# Patient Record
Sex: Female | Born: 1980 | Race: Black or African American | Hispanic: No | Marital: Single | Smoking: Current every day smoker
Health system: Southern US, Community
[De-identification: ages and names within clinical notes are randomized; demographics above are authoritative.]

## PROBLEM LIST (undated history)

## (undated) DIAGNOSIS — M5090 Cervical disc disorder, unspecified, unspecified cervical region: Secondary | ICD-10-CM

## (undated) DIAGNOSIS — I10 Essential (primary) hypertension: Secondary | ICD-10-CM

## (undated) HISTORY — PX: TUBAL LIGATION: SHX77

---

## 2014-01-09 ENCOUNTER — Encounter (HOSPITAL_BASED_OUTPATIENT_CLINIC_OR_DEPARTMENT_OTHER): Payer: Self-pay | Admitting: Emergency Medicine

## 2014-01-09 ENCOUNTER — Emergency Department (HOSPITAL_BASED_OUTPATIENT_CLINIC_OR_DEPARTMENT_OTHER)
Admission: EM | Admit: 2014-01-09 | Discharge: 2014-01-10 | Disposition: A | Discharge status: Home / Self Care | Payer: Medicaid Other | Attending: Emergency Medicine | Admitting: Emergency Medicine

## 2014-01-09 ENCOUNTER — Emergency Department (HOSPITAL_BASED_OUTPATIENT_CLINIC_OR_DEPARTMENT_OTHER): Payer: Medicaid Other

## 2014-01-09 DIAGNOSIS — X58XXXA Exposure to other specified factors, initial encounter: Secondary | ICD-10-CM | POA: Insufficient documentation

## 2014-01-09 DIAGNOSIS — R209 Unspecified disturbances of skin sensation: Secondary | ICD-10-CM | POA: Insufficient documentation

## 2014-01-09 DIAGNOSIS — M542 Cervicalgia: Secondary | ICD-10-CM | POA: Diagnosis present

## 2014-01-09 DIAGNOSIS — M62838 Other muscle spasm: Secondary | ICD-10-CM

## 2014-01-09 DIAGNOSIS — T148XXA Other injury of unspecified body region, initial encounter: Secondary | ICD-10-CM

## 2014-01-09 DIAGNOSIS — S139XXA Sprain of joints and ligaments of unspecified parts of neck, initial encounter: Secondary | ICD-10-CM | POA: Diagnosis not present

## 2014-01-09 DIAGNOSIS — Y929 Unspecified place or not applicable: Secondary | ICD-10-CM | POA: Insufficient documentation

## 2014-01-09 DIAGNOSIS — Z8739 Personal history of other diseases of the musculoskeletal system and connective tissue: Secondary | ICD-10-CM | POA: Diagnosis not present

## 2014-01-09 DIAGNOSIS — Y939 Activity, unspecified: Secondary | ICD-10-CM | POA: Insufficient documentation

## 2014-01-09 HISTORY — DX: Cervical disc disorder, unspecified, unspecified cervical region: M50.90

## 2014-01-09 NOTE — ED Notes (Signed)
C/o neck pain and tingling down left arm x 1 week-denies injury

## 2014-01-10 ENCOUNTER — Encounter (HOSPITAL_BASED_OUTPATIENT_CLINIC_OR_DEPARTMENT_OTHER): Payer: Self-pay | Admitting: Emergency Medicine

## 2014-01-10 MED ORDER — MELOXICAM 15 MG PO TABS: 15.0000 mg | ORAL_TABLET | Freq: Every day | ORAL | Status: AC

## 2014-01-10 MED ORDER — KETOROLAC TROMETHAMINE 60 MG/2ML IM SOLN
60.0000 mg | Freq: Once | INTRAMUSCULAR | Status: AC
  Administered 2014-01-10: 60 mg via INTRAMUSCULAR
  Filled 2014-01-10: qty 2

## 2014-01-10 MED ORDER — LIDOCAINE 5 % EX PTCH: 1.0000 | MEDICATED_PATCH | CUTANEOUS | Status: AC

## 2014-01-10 MED ORDER — METHOCARBAMOL 500 MG PO TABS
1000.0000 mg | ORAL_TABLET | Freq: Once | ORAL | Status: AC
  Administered 2014-01-10: 1000 mg via ORAL
  Filled 2014-01-10: qty 2

## 2014-01-10 MED ORDER — METHOCARBAMOL 750 MG PO TABS: 750.0000 mg | ORAL_TABLET | Freq: Three times a day (TID) | ORAL | Status: DC

## 2014-01-10 NOTE — ED Provider Notes (Signed)
CSN: [id number]     Arrival date & time 01/09/14  2243 History  This chart was scribed for Shakara Tweedy Smitty Cords, MD by Charline Bills, ED Scribe. The patient was seen in room MH05/MH05. Patient's care was started at 12:10 AM.   Chief Complaint  Patient presents with  . Neck Pain   Patient is a 33 y.o. female presenting with neck pain. The history is provided by the patient. No language interpreter was used.  Neck Pain Pain radiates to:  L arm Pain severity:  Moderate Pain is:  Same all the time Onset quality:  Gradual Duration:  1 week Timing:  Constant Progression:  Unchanged Chronicity:  New Context: not fall, not jumping from heights, not MCA, not MVA and not recent injury   Relieved by:  Nothing Worsened by:  Nothing tried Ineffective treatments:  None tried Associated symptoms: no bladder incontinence, no bowel incontinence, no chest pain, no fever, no headaches, no leg pain, no numbness, no paresis, no photophobia, no visual change, no weakness and no weight loss   Risk factors: no hx of head and neck radiation    HPI Comments: Karen Rodgers is a 33 y.o. female who presents to the Emergency Department complaining of constant neck pain onset 1.5 week ago. Pt reports associated tingling sensation that radiates down L arm. Pain is exacerbated with sitting up. She denies injury. Pt has tried ibuprofen 800 mg and hydrocodone with temporary relief. She has also tried hot showers with no relief.   Past Medical History  Diagnosis Date  . Cervical disc disorder    History reviewed. No pertinent past surgical history. No family history on file. History  Substance Use Topics  . Smoking status: Never Smoker   . Smokeless tobacco: Not on file  . Alcohol Use: No   OB History   Grav Para Term Preterm Abortions TAB SAB Ect Mult Living                 Review of Systems  Constitutional: Negative for fever and weight loss.  Eyes: Negative for photophobia.  Cardiovascular:  Negative for chest pain.  Gastrointestinal: Negative for bowel incontinence.  Genitourinary: Negative for bladder incontinence.  Musculoskeletal: Positive for neck pain.  Neurological: Negative for weakness, numbness and headaches.  All other systems reviewed and are negative.  Allergies  Review of patient's allergies indicates no known allergies.  Home Medications   Prior to Admission medications   Not on File   Triage Vitals: BP 173/101  Pulse 66  Temp(Src) 98 F (36.7 C) (Oral)  Resp 18  Ht  (1.575 m)  Wt 155 lb (70.308 kg)  BMI 28.34 kg/m2  SpO2 100%  LMP 12/28/2013 Physical Exam  Nursing note and vitals reviewed. Constitutional: She is oriented to person, place, and time. She appears well-developed and well-nourished. No distress.  HENT:  Head: Normocephalic and atraumatic.  Mouth/Throat: Oropharynx is clear and moist.  Eyes: Conjunctivae and EOM are normal. Pupils are equal, round, and reactive to light.  Neck: Normal range of motion. Neck supple. No JVD present. No tracheal deviation present. No thyromegaly present.  Cardiovascular: Normal rate.   Pulses:      Radial pulses are 3+ on the left side.  Less than 2 seconds capillary refills  Pulmonary/Chest: Effort normal. No stridor. No respiratory distress.  Abdominal: Soft. Bowel sounds are normal. There is no tenderness. There is no rebound.  Musculoskeletal: Normal range of motion.  No stepoff or crepitus to c spine.  Negative Neer's test  Lymphadenopathy:    She has no cervical adenopathy.  Neurological: She is alert and oriented to person, place, and time. She has normal reflexes.  5/5 strength LUE left hand NVI, intact DTRs, negative neers test.  Sensation and motor intact  Skin: Skin is warm and dry.  Psychiatric: She has a normal mood and affect. Her behavior is normal.   ED Course  Procedures (including critical care time) DIAGNOSTIC STUDIES: Oxygen Saturation is 100% on RA, normal by my  interpretation.    COORDINATION OF CARE: 12:14 AM-Discussed treatment plan which includes XR, anti-inflammatory and muscle relaxant with pt at bedside and pt agreed to plan.   Labs Review Labs Reviewed - No data to display  Imaging Review Dg Cervical Spine Complete  01/10/2014   CLINICAL DATA:  Neck pain with tingling down left arm for 1-1/2 weeks with no injury  EXAM: CERVICAL SPINE  4+ VIEWS  COMPARISON:  None.  FINDINGS: Reversed lordosis. No prevertebral soft tissue swelling. No fracture. 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 No foraminal narrowing.  IMPRESSION: No acute osseous abnormalities. 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  Reversed lordosis, possibly due to muscle spasm.   Electronically Signed   By: Esperanza Heir M.D.   On: 01/10/2014 00:01    EKG Interpretation None      MDM   Final diagnoses:  None    Strained muscle will refer to sports medicine.  Patient does not want narcotics as these have not helped  I personally performed the services described in this documentation, which was scribed in my presence. The recorded information has been reviewed and is accurate.    Jasmine Awe, MD 01/10/14 [phone]

## 2015-07-30 ENCOUNTER — Emergency Department (HOSPITAL_BASED_OUTPATIENT_CLINIC_OR_DEPARTMENT_OTHER)
Admission: EM | Admit: 2015-07-30 | Discharge: 2015-07-30 | Disposition: A | Discharge status: Home / Self Care | Payer: Medicaid Other | Attending: Emergency Medicine | Admitting: Emergency Medicine

## 2015-07-30 ENCOUNTER — Encounter (HOSPITAL_BASED_OUTPATIENT_CLINIC_OR_DEPARTMENT_OTHER): Payer: Self-pay | Admitting: *Deleted

## 2015-07-30 DIAGNOSIS — M546 Pain in thoracic spine: Secondary | ICD-10-CM | POA: Insufficient documentation

## 2015-07-30 DIAGNOSIS — I10 Essential (primary) hypertension: Secondary | ICD-10-CM | POA: Insufficient documentation

## 2015-07-30 DIAGNOSIS — Z79899 Other long term (current) drug therapy: Secondary | ICD-10-CM | POA: Diagnosis not present

## 2015-07-30 DIAGNOSIS — Z3202 Encounter for pregnancy test, result negative: Secondary | ICD-10-CM | POA: Diagnosis not present

## 2015-07-30 DIAGNOSIS — Z791 Long term (current) use of non-steroidal anti-inflammatories (NSAID): Secondary | ICD-10-CM | POA: Insufficient documentation

## 2015-07-30 HISTORY — DX: Essential (primary) hypertension: I10

## 2015-07-30 LAB — PREGNANCY, URINE: PREG TEST UR: NEGATIVE

## 2015-07-30 MED ORDER — METHOCARBAMOL 500 MG PO TABS: 500.0000 mg | ORAL_TABLET | Freq: Two times a day (BID) | ORAL | Status: AC | PRN

## 2015-07-30 MED ORDER — HYDROCODONE-ACETAMINOPHEN 5-325 MG PO TABS
2.0000 | ORAL_TABLET | Freq: Once | ORAL | Status: AC
  Administered 2015-07-30: 2 via ORAL
  Filled 2015-07-30: qty 2

## 2015-07-30 MED ORDER — NAPROXEN 500 MG PO TABS: 500.0000 mg | ORAL_TABLET | Freq: Two times a day (BID) | ORAL | Status: AC

## 2015-07-30 NOTE — ED Notes (Addendum)
C/o L upper back pain, onset 3d ago after manual heavy lifting, "think I have strained something", worse with movement and inspiration, associated with CP. (denies: cough, congestion, sob, nvd, fever, sx other than pain), took ibuprofen at 1500 today.  Alert, NAD, calm, interactive, resps e/u, no dyspnea noted.

## 2015-07-30 NOTE — Discharge Instructions (Signed)
Back Pain, Adult °Back pain is very common in adults. The cause of back pain is rarely dangerous and the pain often gets better over time. The cause of your back pain may not be known. Some common causes of back pain include: °1. Strain of the muscles or ligaments supporting the spine. °2. Wear and tear (degeneration) of the spinal disks. °3. Arthritis. °4. Direct injury to the back. °For many people, back pain may return. Since back pain is rarely dangerous, most people can learn to manage this condition on their own. °HOME CARE INSTRUCTIONS °Watch your back pain for any changes. The following actions may help to lessen any discomfort you are feeling: °1. Remain active. It is stressful on your back to sit or stand in one place for long periods of time. Do not sit, drive, or stand in one place for more than 30 minutes at a time. Take short walks on even surfaces as soon as you are able. Try to increase the length of time you walk each day. °2. Exercise regularly as directed by your health care provider. Exercise helps your back heal faster. It also helps avoid future injury by keeping your muscles strong and flexible. °3. Do not stay in bed. Resting more than 1-2 days can delay your recovery. °4. Pay attention to your body when you bend and lift. The most comfortable positions are those that put less stress on your recovering back. Always use proper lifting techniques, including: °1. Bending your knees. °2. Keeping the load close to your body. °3. Avoiding twisting. °5. Find a comfortable position to sleep. Use a firm mattress and lie on your side with your knees slightly bent. If you lie on your back, put a pillow under your knees. °6. Avoid feeling anxious or stressed. Stress increases muscle tension and can worsen back pain. It is important to recognize when you are anxious or stressed and learn ways to manage it, such as with exercise. °7. Take medicines only as directed by your health care provider.  Over-the-counter medicines to reduce pain and inflammation are often the most helpful. Your health care provider may prescribe muscle relaxant drugs. These medicines help dull your pain so you can more quickly return to your normal activities and healthy exercise. °8. Apply ice to the injured area: °1. Put ice in a plastic bag. °2. Place a towel between your skin and the bag. °3. Leave the ice on for 20 minutes, 2-3 times a day for the first 2-3 days. After that, ice and heat may be alternated to reduce pain and spasms. °9. Maintain a healthy weight. Excess weight puts extra stress on your back and makes it difficult to maintain good posture. °SEEK MEDICAL CARE IF: °1. You have pain that is not relieved with rest or medicine. °2. You have increasing pain going down into the legs or buttocks. °3. You have pain that does not improve in one week. °4. You have night pain. °5. You lose weight. °6. You have a fever or chills. °SEEK IMMEDIATE MEDICAL CARE IF:  °1. You develop new bowel or bladder control problems. °2. You have unusual weakness or numbness in your arms or legs. °3. You develop nausea or vomiting. °4. You develop abdominal pain. °5. You feel faint. °  °This information is not intended to replace advice given to you by your health care provider. Make sure you discuss any questions you have with your health care provider. °  °Document Released: 04/07/2005 Document Revised: 04/28/2014 Document Reviewed: 08/09/2013 °Elsevier Interactive Patient Education ©2016 [employer] ° °Back Exercises °The following exercises strengthen the muscles that help to support the back. They also help to keep the lower back flexible. Doing these exercises can help to prevent back pain or lessen existing pain. °If you have back pain or discomfort, try doing these exercises 2-3 times each day or as told by your health care provider. When the pain goes away, do them once each day, but increase the number of times that you repeat the  steps for each exercise (do more repetitions). If you do not have back pain or discomfort, do these exercises once each day or as told by your health care provider. °EXERCISES °Single Knee to Chest °Repeat these steps 3-5 times for each leg: °5. Lie on your back on a firm bed or the floor with your legs extended. °6. Bring one knee to your chest. Your other leg should stay extended and in contact with the floor. °7. Hold your knee in place by grabbing your knee or thigh. °8. Pull on your knee until you feel a gentle stretch in your lower back. °9. Hold the stretch for 10-30 seconds. °10. Slowly release and straighten your leg. °Pelvic Tilt °Repeat these steps 5-10 times: °10. Lie on your back on a firm bed or the floor with your legs extended. °11. Bend your knees so they are pointing toward the ceiling and your feet are flat on the floor. °12. Tighten your lower abdominal muscles to press your lower back against the floor. This motion will tilt your pelvis so your tailbone points up toward the ceiling instead of pointing to your feet or the floor. °13. With gentle tension and even breathing, hold this position for 5-10 seconds. °Cat-Cow °Repeat these steps until your lower back becomes more flexible: °7. Get into a hands-and-knees position on a firm surface. Keep your hands under your shoulders, and keep your knees under your hips. You may place padding under your knees for comfort. °8. Let your head hang down, and point your tailbone toward the floor so your lower back becomes rounded like the back of a cat. °9. Hold this position for 5 seconds. °10. Slowly lift your head and point your tailbone up toward the ceiling so your back forms a sagging arch like the back of a cow. °11. Hold this position for 5 seconds. °Press-Ups °Repeat these steps 5-10 times: °6. Lie on your abdomen (face-down) on the floor. °7. Place your palms near your head, about shoulder-width apart. °8. While you keep your back as relaxed as  possible and keep your hips on the floor, slowly straighten your arms to raise the top half of your body and lift your shoulders. Do not use your back muscles to raise your upper torso. You may adjust the placement of your hands to make yourself more comfortable. °9. Hold this position for 5 seconds while you keep your back relaxed. °10. Slowly return to lying flat on the floor. °Bridges °Repeat these steps 10 times: °1. Lie on your back on a firm surface. °2. Bend your knees so they are pointing toward the ceiling and your feet are flat on the floor. °3. Tighten your buttocks muscles and lift your buttocks off of the floor until your waist is at almost the same height as your knees. You should feel the muscles working in your buttocks and the back of your thighs. If you do not feel these muscles, slide your feet 1-2 inches farther away from your buttocks. °4. Hold this   position for 3-5 seconds. °5. Slowly lower your hips to the starting position, and allow your buttocks muscles to relax completely. °If this exercise is too easy, try doing it with your arms crossed over your chest. °Abdominal Crunches °Repeat these steps 5-10 times: °1. Lie on your back on a firm bed or the floor with your legs extended. °2. Bend your knees so they are pointing toward the ceiling and your feet are flat on the floor. °3. Cross your arms over your chest. °4. Tip your chin slightly toward your chest without bending your neck. °5. Tighten your abdominal muscles and slowly raise your trunk (torso) high enough to lift your shoulder blades a tiny bit off of the floor. Avoid raising your torso higher than that, because it can put too much stress on your low back and it does not help to strengthen your abdominal muscles. °6. Slowly return to your starting position. °Back Lifts °Repeat these steps 5-10 times: °1. Lie on your abdomen (face-down) with your arms at your sides, and rest your forehead on the floor. °2. Tighten the muscles in your  legs and your buttocks. °3. Slowly lift your chest off of the floor while you keep your hips pressed to the floor. Keep the back of your head in line with the curve in your back. Your eyes should be looking at the floor. °4. Hold this position for 3-5 seconds. °5. Slowly return to your starting position. °SEEK MEDICAL CARE IF: °· Your back pain or discomfort gets much worse when you do an exercise. °· Your back pain or discomfort does not lessen within 2 hours after you exercise. °If you have any of these problems, stop doing these exercises right away. Do not do them again unless your health care provider says that you can. °SEEK IMMEDIATE MEDICAL CARE IF: °· You develop sudden, severe back pain. If this happens, stop doing the exercises right away. Do not do them again unless your health care provider says that you can. °  °This information is not intended to replace advice given to you by your health care provider. Make sure you discuss any questions you have with your health care provider. °  °Document Released: 05/15/2004 Document Revised: 12/27/2014 Document Reviewed: 06/01/2014 °Elsevier Interactive Patient Education ©2016 [employer] ° °

## 2015-07-30 NOTE — ED Provider Notes (Signed)
CSN: [id number]     Arrival date & time 07/30/15  0000 History   First MD Initiated Contact with Patient 07/30/15 0017     Chief Complaint  Patient presents with  . Back Pain    Karen Rodgers is a 35 y.o. female who presents to the ED complaining of left upper back pain for the past 3 days. She reports she was moving a heavy box at work 3 days ago and after began having pain to her left upper back. Her pain is worse with movement. She currently complains of 5 out of 10 pain. She reports taking ibuprofen earlier this morning with some relief. She denies falls or trauma to her back. She denies fevers, chest pain, shortness of breath, numbness, tingling, weakness, leg pain, leg swelling, hemoptysis, endogenous estrogen use, recent surgery, abdominal pain, nausea, vomiting, diarrhea, or rashes.   Patient is a 35 y.o. female presenting with back pain. The history is provided by the patient. No language interpreter was used.  Back Pain Associated symptoms: no abdominal pain, no chest pain, no dysuria, no fever, no headaches, no numbness and no weakness     Past Medical History  Diagnosis Date  . Cervical disc disorder   . Hypertension    History reviewed. No pertinent past surgical history. History reviewed. No pertinent family history. Social History  Substance Use Topics  . Smoking status: Never Smoker   . Smokeless tobacco: None  . Alcohol Use: No   OB History    No data available     Review of Systems  Constitutional: Negative for fever and chills.  HENT: Negative for congestion and sore throat.   Eyes: Negative for visual disturbance.  Respiratory: Negative for cough and shortness of breath.   Cardiovascular: Negative for chest pain and leg swelling.  Gastrointestinal: Negative for nausea, vomiting, abdominal pain and diarrhea.  Genitourinary: Negative for dysuria, urgency, frequency, hematuria, vaginal bleeding, vaginal discharge and difficulty urinating.  Musculoskeletal:  Positive for back pain. Negative for gait problem and neck pain.  Skin: Negative for rash.  Neurological: Negative for weakness, numbness and headaches.      Allergies  Review of patient's allergies indicates no known allergies.  Home Medications   Prior to Admission medications   Medication Sig Start Date End Date Taking? Authorizing Provider  lidocaine (LIDODERM) 5 % Place 1 patch onto the skin daily. Remove & Discard patch within 12 hours or as directed by MD 01/10/14   April Palumbo, MD  meloxicam (MOBIC) 15 MG tablet Take 1 tablet (15 mg total) by mouth daily. 01/10/14   April Palumbo, MD  methocarbamol (ROBAXIN) 500 MG tablet Take 1 tablet (500 mg total) by mouth 2 (two) times daily as needed for muscle spasms. 07/30/15   Everlene Farrier, PA-C  naproxen (NAPROSYN) 500 MG tablet Take 1 tablet (500 mg total) by mouth 2 (two) times daily with a meal. 07/30/15   Everlene Farrier, PA-C   BP 150/95 mmHg  Pulse 69  Temp(Src) 99.3 F (37.4 C) (Oral)  Resp 18  Ht  (1.575 m)  Wt 72.53 kg  BMI 29.24 kg/m2  SpO2 99%  LMP 07/19/2015 (Approximate) Physical Exam  Constitutional: She appears well-developed and well-nourished. No distress.  Nontoxic appearing.  HENT:  Head: Normocephalic and atraumatic.  Mouth/Throat: Oropharynx is clear and moist.  Eyes: Conjunctivae are normal. Pupils are equal, round, and reactive to light. Right eye exhibits no discharge. Left eye exhibits no discharge.  Neck: Neck supple. No JVD  present.  Cardiovascular: Normal rate, regular rhythm, normal heart sounds and intact distal pulses.  Exam reveals no gallop and no friction rub.   No murmur heard. Pulmonary/Chest: Effort normal and breath sounds normal. No respiratory distress. She has no wheezes. She has no rales. She exhibits no tenderness.  Lungs clear to auscultation bilaterally. Symmetric chest expansion bilaterally.  Abdominal: Soft. There is no tenderness. There is no guarding.  Musculoskeletal:  Normal range of motion. She exhibits tenderness. She exhibits no edema.       Arms: Patient has point tenderness to palpation to her left mid back. No midline neck or back tenderness to palpation. No crepitus.  Patient is spontaneously moving all extremities in a coordinated fashion exhibiting good strength.   Lymphadenopathy:    She has no cervical adenopathy.  Neurological: She is alert. She has normal reflexes. She displays normal reflexes. Coordination normal.  Sensation is intact to her bilateral upper and lower extremities. Bilateral patellar DTRs are intact. Normal gait.   Skin: Skin is warm and dry. No rash noted. She is not diaphoretic. No erythema. No pallor.  Psychiatric: She has a normal mood and affect. Her behavior is normal.  Nursing note and vitals reviewed.   ED Course  Procedures (including critical care time) Labs Review Labs Reviewed  PREGNANCY, URINE    Imaging Review No results found. I have personally reviewed and evaluated these lab results as part of my medical decision-making.   EKG Interpretation None      Filed Vitals:   07/30/15 0012 07/30/15 0014  BP: 150/95   Pulse: 69   Temp: 99.3 F (37.4 C)   TempSrc: Oral   Resp: 18   Height:  5\' 2"  (1.575 m)  Weight:  72.53 kg  SpO2: 99%      MDM   Meds given in ED:  Medications  HYDROcodone-acetaminophen (NORCO/VICODIN) 5-325 MG per tablet 2 tablet (2 tablets Oral Given 07/30/15 0039)    New Prescriptions   METHOCARBAMOL (ROBAXIN) 500 MG TABLET    Take 1 tablet (500 mg total) by mouth 2 (two) times daily as needed for muscle spasms.   NAPROXEN (NAPROSYN) 500 MG TABLET    Take 1 tablet (500 mg total) by mouth 2 (two) times daily with a meal.    Final diagnoses:  Left-sided thoracic back pain   This is a 35 y.o. female who presents to the ED complaining of left upper back pain for the past 3 days. She reports she was moving a heavy box at work 3 days ago and after began having pain to her left  upper back. Her pain is worse with movement. She currently complains of 5 out of 10 pain. She reports taking ibuprofen earlier this morning with some relief. She denies falls or trauma to her back. She denies fevers, chest pain, shortness of breath. No back pain red flags. PERC negative. On exam the patient is afebrile nontoxic appearing. She has point tenderness to her left mid back. No midline neck or back tenderness. No focal neurological deficits. No concern for cauda equina. Urine pregnancy is negative. Will discharge with prescriptions for naproxen and Robaxin. I advised the patient to follow-up with their primary care provider this week. I advised the patient to return to the emergency department with new or worsening symptoms or new concerns. The patient verbalized understanding and agreement with plan.      Everlene FarrierWilliam Danaysha Kirn, PA-C 07/30/15 0105  Cy BlamerApril Palumbo, MD 07/30/15 [phone]

## 2016-03-29 IMAGING — CR DG CERVICAL SPINE COMPLETE 4+V
5 series · 5 of 5 positions shown · non-contrast
Comparison: None.

CLINICAL DATA: Neck pain with tingling down left arm for 1-1/2
weeks with no injury

EXAM:
CERVICAL SPINE  4+ VIEWS

[w c-spine lat]
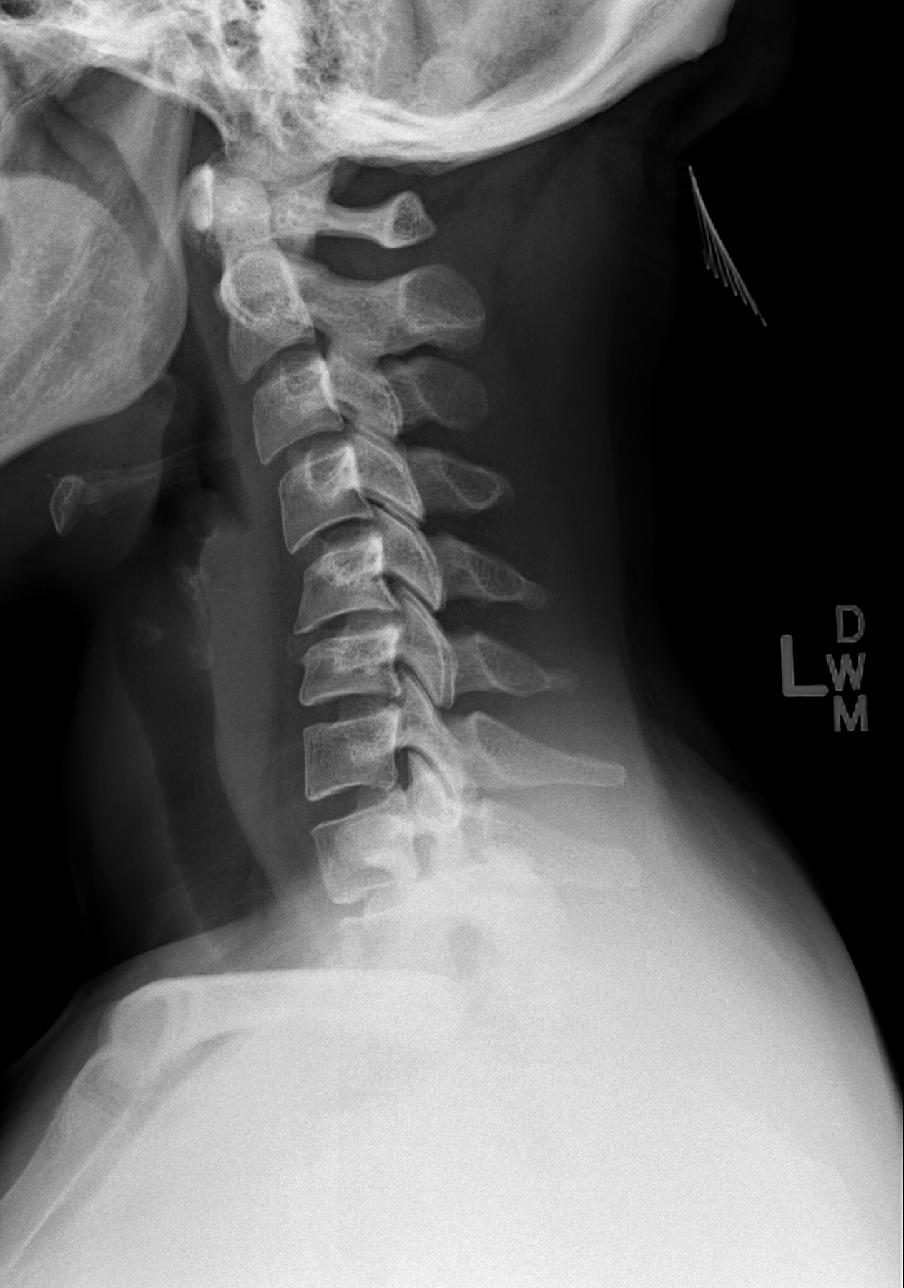

[w c-spine oblique (1 of 2)]
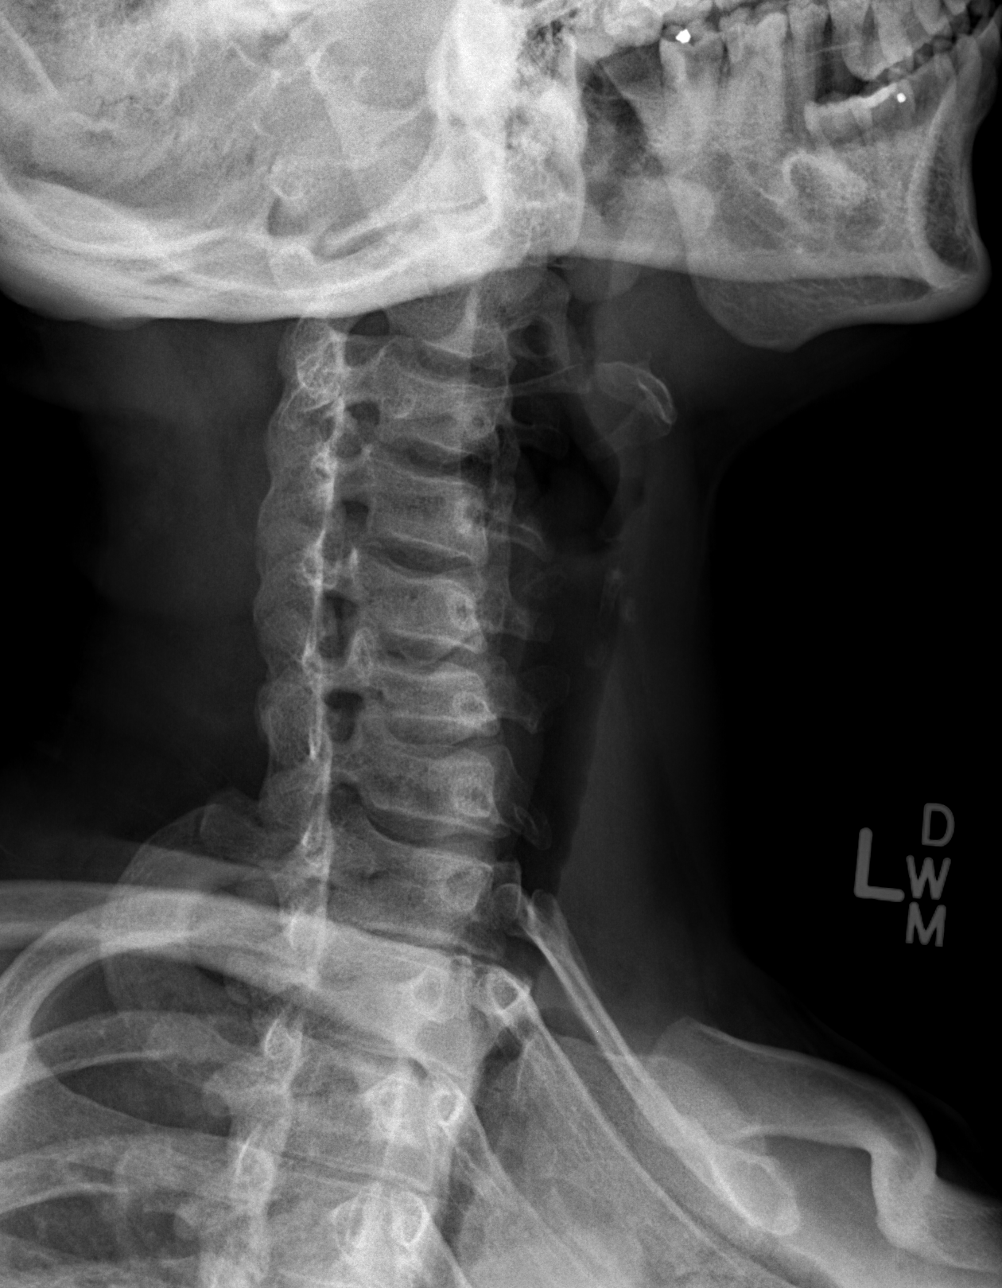

[w c-spine oblique (2 of 2)]
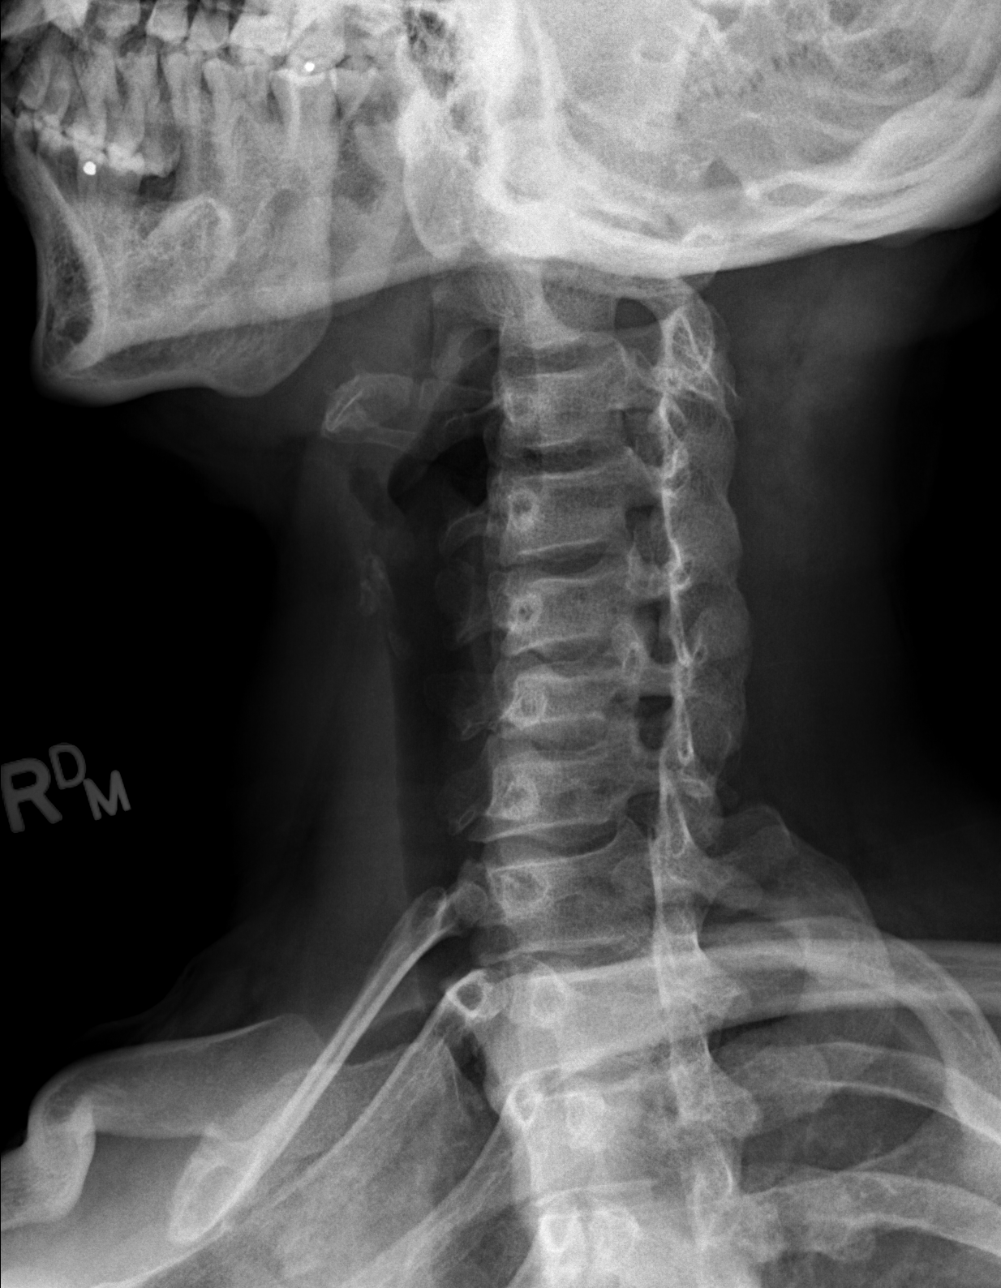

[w c-spine a.p.]
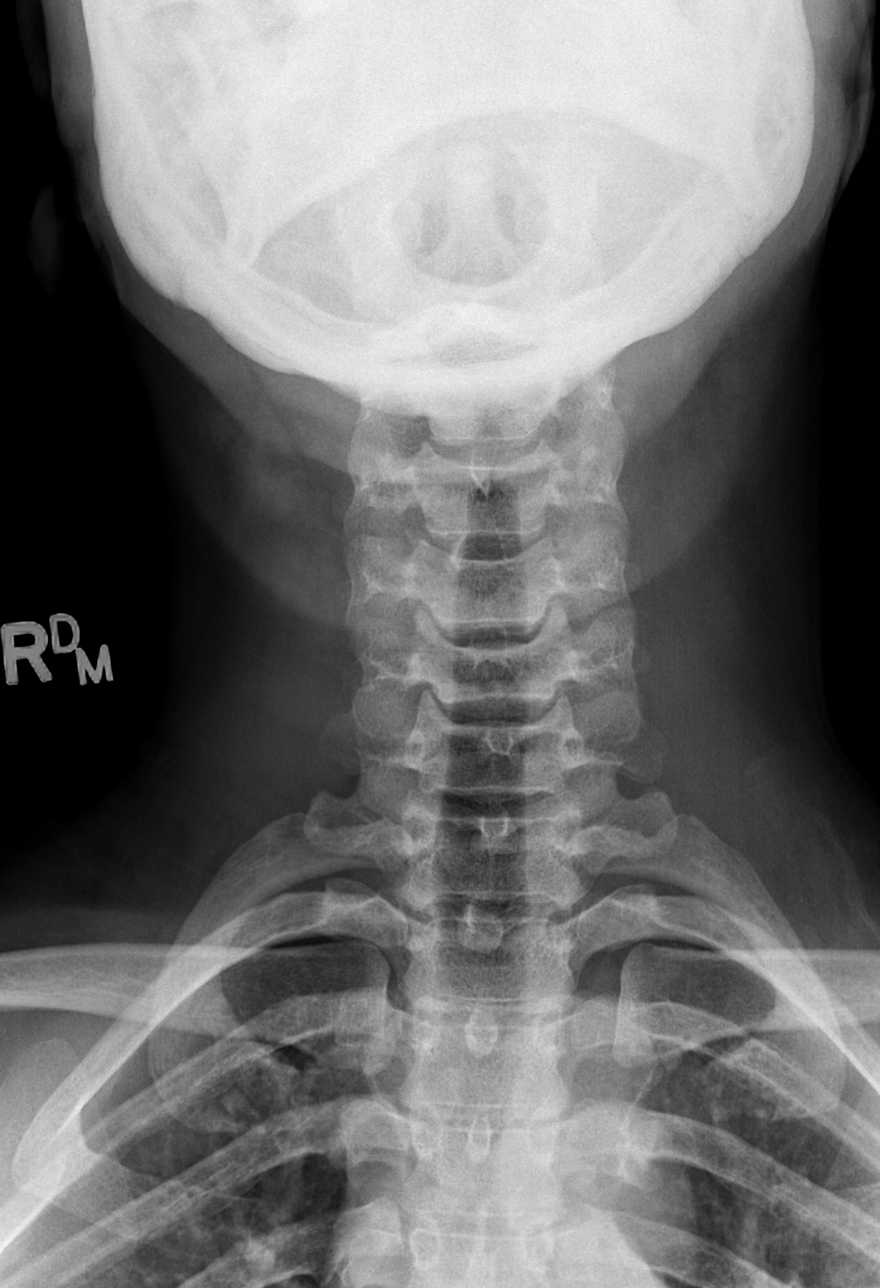

[w c-spine odontoid]
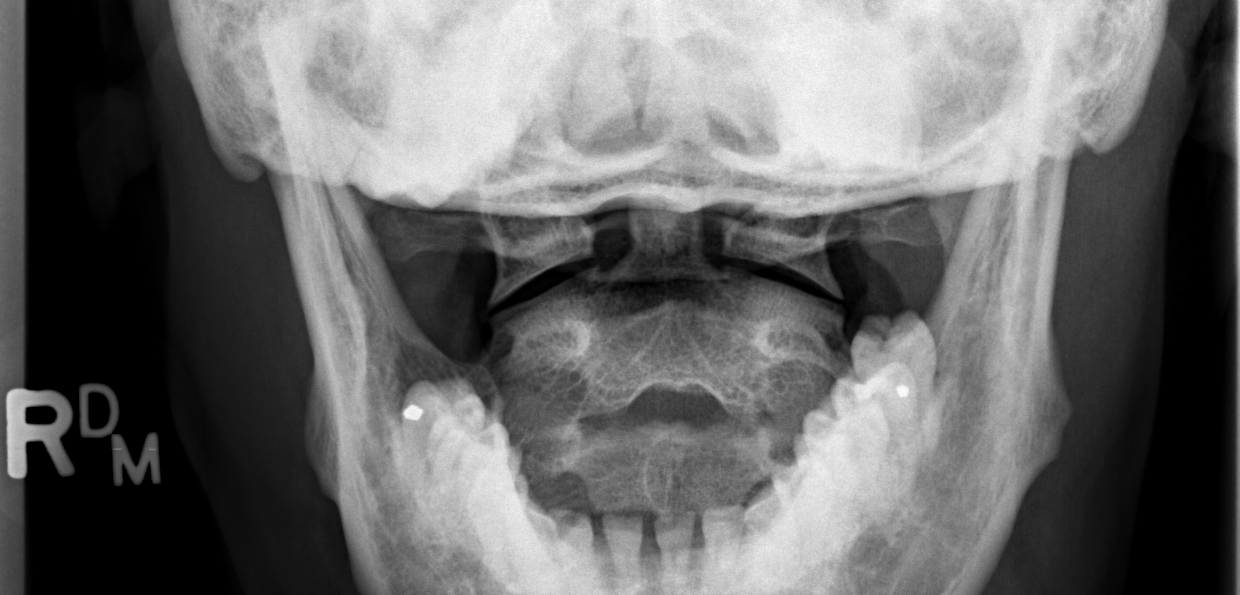

[5 of 5 positions shown; findings below may reference images not displayed]

FINDINGS: Reversed lordosis. No prevertebral soft tissue swelling. No
fracture. Mild C5-6 degenerative disc disease. No foraminal
narrowing.
IMPRESSION: No acute osseous abnormalities. Mild C5-6 degenerative disc disease.

Reversed lordosis, possibly due to muscle spasm.

## 2017-03-19 ENCOUNTER — Emergency Department (HOSPITAL_BASED_OUTPATIENT_CLINIC_OR_DEPARTMENT_OTHER)
Admission: EM | Admit: 2017-03-19 | Discharge: 2017-03-19 | Disposition: A | Discharge status: Home / Self Care | Payer: Self-pay | Attending: Emergency Medicine | Admitting: Emergency Medicine

## 2017-03-19 ENCOUNTER — Other Ambulatory Visit: Payer: Self-pay

## 2017-03-19 ENCOUNTER — Encounter (HOSPITAL_BASED_OUTPATIENT_CLINIC_OR_DEPARTMENT_OTHER): Payer: Self-pay | Admitting: *Deleted

## 2017-03-19 DIAGNOSIS — I10 Essential (primary) hypertension: Secondary | ICD-10-CM | POA: Insufficient documentation

## 2017-03-19 DIAGNOSIS — M7122 Synovial cyst of popliteal space [Baker], left knee: Secondary | ICD-10-CM | POA: Insufficient documentation

## 2017-03-19 DIAGNOSIS — M25562 Pain in left knee: Secondary | ICD-10-CM

## 2017-03-19 MED ORDER — IBUPROFEN 800 MG PO TABS
800.0000 mg | ORAL_TABLET | Freq: Once | ORAL | Status: AC
  Administered 2017-03-19: 800 mg via ORAL
  Filled 2017-03-19: qty 1

## 2017-03-19 MED ORDER — ACETAMINOPHEN 500 MG PO TABS
1000.0000 mg | ORAL_TABLET | Freq: Once | ORAL | Status: AC
  Administered 2017-03-19: 1000 mg via ORAL
  Filled 2017-03-19: qty 2

## 2017-03-19 NOTE — Discharge Instructions (Signed)
Take 4 over the counter ibuprofen tablets 3 times a day or 2 over-the-counter naproxen tablets twice a day for pain. Also take tylenol 1000mg(2 extra strength) four times a day.    

## 2017-03-19 NOTE — ED Provider Notes (Signed)
[facility] EMERGENCY DEPARTMENT Provider Note   CSN: [id number] Arrival date & time: 03/19/17  0708     History   Chief Complaint Chief Complaint  Patient presents with  . Leg Pain    HPI Karen Rodgers is a [age] y.o. female.  36 yo F with a chief complaint of left knee pain and swelling.  This been going on for the past 5 months.  Worse with movement and palpation.  Worse at the end of the day.  Describes as a dull ache.  Has taken things transiently with some improvement.  Recently tried prednisone for a couple days which mildly improved her symptoms.  She denies chest pain or shortness of breath.  Denies radiation of the pain.  Denies numbness or tingling to the leg.   The history is provided by the patient.  Leg Pain   This is a new problem. The current episode started more than 1 week ago. The problem occurs constantly. The problem has been gradually worsening. The pain is present in the left knee. The quality of the pain is described as aching. The pain is at a severity of 6/10. The pain is mild. The symptoms are aggravated by activity and contact. Treatments tried: Prednisone. The treatment provided mild relief. There has been no history of extremity trauma.    Past Medical History:  Diagnosis Date  . Cervical disc disorder   . Hypertension     There are no active problems to display for this patient.   History reviewed. No pertinent surgical history.  OB History    No data available       Home Medications    Prior to Admission medications   Medication Sig Start Date End Date Taking? Authorizing Provider  lidocaine (LIDODERM) 5 % Place 1 patch onto the skin daily. Remove & Discard patch within 12 hours or as directed by MD 01/10/14   Nicanor AlconPalumbo, April, MD  meloxicam (MOBIC) 15 MG tablet Take 1 tablet (15 mg total) by mouth daily. 01/10/14   Palumbo, April, MD  methocarbamol (ROBAXIN) 500 MG tablet Take 1 tablet (500 mg total) by mouth 2 (two) times  daily as needed for muscle spasms. 07/30/15   Everlene Farrieransie, William, PA-C  naproxen (NAPROSYN) 500 MG tablet Take 1 tablet (500 mg total) by mouth 2 (two) times daily with a meal. 07/30/15   Everlene Farrieransie, William, PA-C    Family History History reviewed. No pertinent family history.  Social History Social History   Tobacco Use  . Smoking status: Never Smoker  . Smokeless tobacco: Never Used  Substance Use Topics  . Alcohol use: No  . Drug use: No     Allergies   Patient has no known allergies.   Review of Systems Review of Systems  Constitutional: Negative for chills and fever.  HENT: Negative for congestion and rhinorrhea.   Eyes: Negative for redness and visual disturbance.  Respiratory: Negative for shortness of breath and wheezing.   Cardiovascular: Positive for leg swelling. Negative for chest pain and palpitations.  Gastrointestinal: Negative for nausea and vomiting.  Genitourinary: Negative for dysuria and urgency.  Musculoskeletal: Positive for arthralgias and myalgias.  Skin: Negative for pallor and wound.  Neurological: Negative for dizziness and headaches.     Physical Exam Updated Vital Signs BP (!) 155/82 (BP Location: Right Arm)   Pulse 63   Temp 98.4 F (36.9 C) (Oral)   Resp 18   Ht 5' 1.5" (1.562 m)   Wt 75.3 kg (  166 lb)   LMP 03/05/2017   SpO2 100%   BMI 30.86 kg/m   Physical Exam  Constitutional: She is oriented to person, place, and time. She appears well-developed and well-nourished. No distress.  HENT:  Head: Normocephalic and atraumatic.  Eyes: EOM are normal. Pupils are equal, round, and reactive to light.  Neck: Normal range of motion. Neck supple.  Cardiovascular: Normal rate and regular rhythm. Exam reveals no gallop and no friction rub.  No murmur heard. Pulmonary/Chest: Effort normal. She has no wheezes. She has no rales.  Abdominal: Soft. She exhibits no distension. There is no tenderness.  Musculoskeletal: She exhibits edema and  tenderness.  Mild fullness to the posterior aspect of the left knee.  Intact ligaments.  Full range of motion.  Mild anterior swelling.  No erythema or warmth.  Neurological: She is alert and oriented to person, place, and time.  Skin: Skin is warm and dry. She is not diaphoretic.  Psychiatric: She has a normal mood and affect. Her behavior is normal.  Nursing note and vitals reviewed.    ED Treatments / Results  Labs (all labs ordered are listed, but only abnormal results are displayed) Labs Reviewed - No data to display  EKG  EKG Interpretation None       Radiology No results found.  Procedures Procedures (including critical care time)  Medications Ordered in ED Medications  acetaminophen (TYLENOL) tablet 1,000 mg (1,000 mg Oral Given 03/19/17 0741)  ibuprofen (ADVIL,MOTRIN) tablet 800 mg (800 mg Oral Given 03/19/17 0741)     Initial Impression / Assessment and Plan / ED Course  I have reviewed the triage vital signs and the nursing notes.  Pertinent labs & imaging results that were available during my care of the patient were reviewed by me and considered in my medical decision making (see chart for details).     36 yo F with a chief complaint of left posterior leg pain and swelling.  Clinically the patient has a Baker's cyst.  She has mild fluid to the left knee.  There is no erythema no warmth.  Full range of motion.  No ligamentous disruption on exam.  We will treat the patient symptomatically.  Have her follow-up with her PCP.   7:44 AM:  I have discussed the diagnosis/risks/treatment options with the patient and believe the pt to be eligible for discharge home to follow-up with PCP. We also discussed returning to the ED immediately if new or worsening sx occur. We discussed the sx which are most concerning (e.g., sudden worsening pain, fever, inability to tolerate by mouth) that necessitate immediate return. Medications administered to the patient during their  visit and any new prescriptions provided to the patient are listed below.  Medications given during this visit Medications  acetaminophen (TYLENOL) tablet 1,000 mg (1,000 mg Oral Given 03/19/17 0741)  ibuprofen (ADVIL,MOTRIN) tablet 800 mg (800 mg Oral Given 03/19/17 0741)     The patient appears reasonably screen and/or stabilized for discharge and I doubt any other medical condition or other [facility] requiring further screening, evaluation, or treatment in the ED at this time prior to discharge.    Final Clinical Impressions(s) / ED Diagnoses   Final diagnoses:  Acute pain of left knee  Baker's cyst of knee, left    ED Discharge Orders    None       Melene PlanFloyd, Jaeli Grubb, DO 03/19/17 [phone]

## 2017-03-19 NOTE — ED Triage Notes (Signed)
Pt reports 6 months of swelling and intermittent pain to her left knee. Pt states after looking on the internet she took 2 of her daughters prednisone yesterday and they helped.denies injury or trauma

## 2019-07-02 ENCOUNTER — Encounter (HOSPITAL_BASED_OUTPATIENT_CLINIC_OR_DEPARTMENT_OTHER): Payer: Self-pay | Admitting: Emergency Medicine

## 2019-07-02 ENCOUNTER — Emergency Department (HOSPITAL_BASED_OUTPATIENT_CLINIC_OR_DEPARTMENT_OTHER)
Admission: EM | Admit: 2019-07-02 | Discharge: 2019-07-02 | Disposition: A | Discharge status: Home / Self Care | Payer: Self-pay | Attending: Emergency Medicine | Admitting: Emergency Medicine

## 2019-07-02 ENCOUNTER — Other Ambulatory Visit: Payer: Self-pay

## 2019-07-02 DIAGNOSIS — Y939 Activity, unspecified: Secondary | ICD-10-CM | POA: Insufficient documentation

## 2019-07-02 DIAGNOSIS — S70262A Insect bite (nonvenomous), left hip, initial encounter: Secondary | ICD-10-CM | POA: Insufficient documentation

## 2019-07-02 DIAGNOSIS — Z79899 Other long term (current) drug therapy: Secondary | ICD-10-CM | POA: Insufficient documentation

## 2019-07-02 DIAGNOSIS — Y9259 Other trade areas as the place of occurrence of the external cause: Secondary | ICD-10-CM | POA: Insufficient documentation

## 2019-07-02 DIAGNOSIS — I10 Essential (primary) hypertension: Secondary | ICD-10-CM | POA: Insufficient documentation

## 2019-07-02 DIAGNOSIS — Y999 Unspecified external cause status: Secondary | ICD-10-CM | POA: Insufficient documentation

## 2019-07-02 DIAGNOSIS — Z590 Homelessness: Secondary | ICD-10-CM | POA: Insufficient documentation

## 2019-07-02 DIAGNOSIS — W57XXXA Bitten or stung by nonvenomous insect and other nonvenomous arthropods, initial encounter: Secondary | ICD-10-CM | POA: Insufficient documentation

## 2019-07-02 MED ORDER — DIPHENHYDRAMINE HCL 25 MG PO TABS: 25.0000 mg | ORAL_TABLET | Freq: Four times a day (QID) | ORAL | 0 refills | Status: AC

## 2019-07-02 MED ORDER — HYDROCORTISONE 1 % EX CREA: TOPICAL_CREAM | CUTANEOUS | 0 refills | Status: AC

## 2019-07-02 NOTE — ED Provider Notes (Signed)
[facility] EMERGENCY DEPARTMENT Provider Note   CSN: [id number] Arrival date & time: 07/02/19  0932     History Chief Complaint  Patient presents with  . Insect Bite    Karen Rodgers is a 39 y.o. female with history of hypertension presents for evaluation of acute onset, persistent itching from insect bites noted today.  She states that she is homeless but staying in a hotel.  She awoke this morning and noted itching and erythema to a new lesion on her left hip, then noted another to her left upper arm.  Finally just before coming here she noted some itching to the right side of the neck and found a bedbug had just bit her.  She was able to capture the bedbug on her neck and place him in a cup.  She denies any new soaps, shampoos, detergents, or lotions.  She denies any fever, facial swelling, shortness of breath, difficulty swallowing or breathing.  No nausea or vomiting.  Has not tried anything for her symptoms.  She did bring in the insect which does appear to be a bedbug.  It does not appear to be a tick.  The history is provided by the patient.       Past Medical History:  Diagnosis Date  . Cervical disc disorder   . Hypertension     There are no problems to display for this patient.   History reviewed. No pertinent surgical history.   OB History   No obstetric history on file.     No family history on file.  Social History   Tobacco Use  . Smoking status: Never Smoker  . Smokeless tobacco: Never Used  Substance Use Topics  . Alcohol use: No  . Drug use: No    Home Medications Prior to Admission medications   Medication Sig Start Date End Date Taking? Authorizing Provider  diphenhydrAMINE (BENADRYL) 25 MG tablet Take 1 tablet (25 mg total) by mouth every 6 (six) hours. 07/02/19   Luevenia Maxin, Elpidio Thielen A, PA-C  hydrocortisone cream 1 % Apply to affected area 2 times daily 07/02/19   Luevenia Maxin, Mounir Skipper A, PA-C  lidocaine (LIDODERM) 5 % Place 1 patch onto the skin  daily. Remove & Discard patch within 12 hours or as directed by MD 01/10/14   Nicanor Alcon, April, MD  meloxicam (MOBIC) 15 MG tablet Take 1 tablet (15 mg total) by mouth daily. 01/10/14   Palumbo, April, MD  methocarbamol (ROBAXIN) 500 MG tablet Take 1 tablet (500 mg total) by mouth 2 (two) times daily as needed for muscle spasms. 07/30/15   Everlene Farrier, PA-C  naproxen (NAPROSYN) 500 MG tablet Take 1 tablet (500 mg total) by mouth 2 (two) times daily with a meal. 07/30/15   Everlene Farrier, PA-C    Allergies    Patient has no known allergies.  Review of Systems   Review of Systems  Constitutional: Negative for fever.  HENT: Negative for facial swelling.   Respiratory: Negative for shortness of breath.   Skin: Positive for rash.    Physical Exam Updated Vital Signs BP (!) 161/114 (BP Location: Left Arm)   Pulse 90   Temp 98.6 F (37 C) (Oral)   Resp 18   Ht 5\' 2"  (1.575 m)   Wt 75 kg   SpO2 100%   BMI 30.24 kg/m   Physical Exam Vitals and nursing note reviewed.  Constitutional:      General: She is not in acute distress.    Appearance:  She is well-developed.     Comments: Resting comfortably in bed.  She has a cup containing an insect in it at the bedside.  HENT:     Head: Normocephalic and atraumatic.     Mouth/Throat:     Comments: Tolerating secretions without difficulty, no upper airway stridor Eyes:     General:        Right eye: No discharge.        Left eye: No discharge.     Conjunctiva/sclera: Conjunctivae normal.  Neck:     Vascular: No JVD.     Trachea: No tracheal deviation.  Cardiovascular:     Rate and Rhythm: Normal rate.  Pulmonary:     Effort: Pulmonary effort is normal.     Comments: Speaking in full sentences without difficulty. Abdominal:     General: There is no distension.  Skin:    General: Skin is warm and dry.     Findings: Erythema present.     Comments: Erythema and mild induration noted to left upper arm.  Similar lesions noted to the  left hip and right side of the neck.  Excoriations noted.  No bulla or vesicles.  No desquamation.  Nikolsky sign absent.  Neurological:     Mental Status: She is alert.  Psychiatric:        Behavior: Behavior normal.     ED Results / Procedures / Treatments   Labs (all labs ordered are listed, but only abnormal results are displayed) Labs Reviewed - No data to display  EKG None  Radiology No results found.  Procedures Procedures (including critical care time)  Medications Ordered in ED Medications - No data to display  ED Course  I have reviewed the triage vital signs and the nursing notes.  Pertinent labs & imaging results that were available during my care of the patient were reviewed by me and considered in my medical decision making (see chart for details).    MDM Rules/Calculators/A&P                      Rash most consistent with insect bite.  Does not appear to be a tick bite.  Patient is afebrile, hypertensive but has a history of hypertension is not currently taking any medications.  I did recommend that she follow-up with a PCP for reevaluation and to closely monitor her blood pressures when ever possible.  She has no evidence of anaphylaxis or angioedema.  No signs of airway compromise.  Doubt [facility] spotted fever, SJS, TENS, syphilis.  She has a bedbug in a cup at the bedside.  Recommend hydrocortisone cream for itching, Benadryl as well.  She reports that she is not going back to the hotel.  She will follow-up with PCP for reevaluation of rash and hypertension.  Discussed strict ED return precautions. Patient verbalized understanding of and agreement with plan and is safe for discharge home at this time.  Final Clinical Impression(s) / ED Diagnoses Final diagnoses:  Insect bite, unspecified site, initial encounter  Hypertension, unspecified type    Rx / DC Orders ED Discharge Orders         Ordered    diphenhydrAMINE (BENADRYL) 25 MG tablet  Every 6  hours     07/02/19 1003    hydrocortisone cream 1 %     07/02/19 1003           Renita Papa, PA-C 07/02/19 1009    Isla Pence, MD 07/02/19  1010  

## 2019-07-02 NOTE — ED Triage Notes (Signed)
C/o several insect bites. States possible bed bugs and has a bug in a cup with her.

## 2019-07-02 NOTE — Discharge Instructions (Signed)
Apply hydrocortisone cream to areas of itching.  You can take Benadryl every 6 hours as needed for itching.  Be aware this can cause drowsiness.  Avoid itching the areas.  You can use oatmeal baths or calamine lotion to soothe the bites as well.  If your blood pressure (BP) was elevated on multiple readings during this visit above 130 for the top number or above 80 for the bottom number, please have this repeated by your primary care provider within one month. You can also check your blood pressure when you are out at a pharmacy or grocery store. Many have machines that will check your blood pressure.  If your blood pressure remains elevated, please follow-up with your PCP.  Return to the emergency department if any concerning signs or symptoms

## 2019-10-09 ENCOUNTER — Emergency Department (HOSPITAL_BASED_OUTPATIENT_CLINIC_OR_DEPARTMENT_OTHER): Payer: Medicare Other

## 2019-10-09 ENCOUNTER — Other Ambulatory Visit: Payer: Self-pay

## 2019-10-09 ENCOUNTER — Encounter (HOSPITAL_BASED_OUTPATIENT_CLINIC_OR_DEPARTMENT_OTHER): Payer: Self-pay | Admitting: *Deleted

## 2019-10-09 ENCOUNTER — Emergency Department (HOSPITAL_BASED_OUTPATIENT_CLINIC_OR_DEPARTMENT_OTHER)
Admission: EM | Admit: 2019-10-09 | Discharge: 2019-10-09 | Disposition: A | Discharge status: Home / Self Care | Payer: Medicare Other | Attending: Emergency Medicine | Admitting: Emergency Medicine

## 2019-10-09 DIAGNOSIS — R079 Chest pain, unspecified: Secondary | ICD-10-CM | POA: Diagnosis present

## 2019-10-09 DIAGNOSIS — F17210 Nicotine dependence, cigarettes, uncomplicated: Secondary | ICD-10-CM | POA: Diagnosis not present

## 2019-10-09 DIAGNOSIS — I10 Essential (primary) hypertension: Secondary | ICD-10-CM | POA: Diagnosis not present

## 2019-10-09 DIAGNOSIS — R0789 Other chest pain: Secondary | ICD-10-CM | POA: Diagnosis not present

## 2019-10-09 LAB — TROPONIN I (HIGH SENSITIVITY): Troponin I (High Sensitivity): 6 ng/L (ref <18)

## 2019-10-09 MED ORDER — IBUPROFEN 800 MG PO TABS
800.0000 mg | ORAL_TABLET | Freq: Once | ORAL | Status: AC
  Administered 2019-10-09: 800 mg via ORAL
  Filled 2019-10-09: qty 1

## 2019-10-09 MED ORDER — IBUPROFEN 800 MG PO TABS: 800.0000 mg | ORAL_TABLET | Freq: Three times a day (TID) | ORAL | 0 refills | Status: AC

## 2019-10-09 MED ORDER — ACETAMINOPHEN 500 MG PO TABS
1000.0000 mg | ORAL_TABLET | Freq: Once | ORAL | Status: AC
  Administered 2019-10-09: 1000 mg via ORAL
  Filled 2019-10-09: qty 2

## 2019-10-09 MED ORDER — LIDOCAINE 5 % EX PTCH: 2.0000 | MEDICATED_PATCH | CUTANEOUS | Status: DC

## 2019-10-09 NOTE — ED Notes (Signed)
RN to room to get blood and urine sample. Pt states she does not want urine testing or blood testing as she feels she has pulled a muscle and testing is not necessary. EDP notified.

## 2019-10-09 NOTE — ED Triage Notes (Addendum)
Pt states that she started having chest pain with movement that started on Friday. States pain with inspiration caused by pain. Denies any other symptoms. Has not taken anything for pain. Denies any recent travel or long car ride

## 2019-10-09 NOTE — ED Provider Notes (Signed)
[facility] EMERGENCY DEPARTMENT Provider Note   CSN: [id number] Arrival date & time: 10/09/19  0203     History Chief Complaint  Patient presents with  . Chest Pain    Karen Rodgers is a 39 y.o. female.  The history is provided by the patient.  Chest Pain Pain location:  R lateral chest Pain quality: aching   Pain radiates to:  Does not radiate Pain severity:  Moderate Onset quality:  Gradual Duration:  2 days Timing:  Constant Progression:  Unchanged Chronicity:  New Context: lifting and movement   Relieved by:  Nothing Worsened by:  Nothing Ineffective treatments:  None tried Associated symptoms: no abdominal pain, no altered mental status, no anorexia, no anxiety, no back pain, no claudication, no cough, no diaphoresis, no dizziness, no dysphagia, no fatigue, no fever, no headache, no heartburn, no lower extremity edema, no nausea, no near-syncope, no numbness, no orthopnea, no palpitations, no PND, no shortness of breath, no syncope, no vomiting and no weakness   Risk factors: no coronary artery disease, no diabetes mellitus, no prior DVT/PE and no surgery   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  No DOE, no SOB. No n/v/d.  No leg pain or swelling.  No travel.  No pleuritic pain.       Past Medical History:  Diagnosis Date  . Cervical disc disorder   . Hypertension     There are no problems to display for this patient.   Past Surgical History:  Procedure Laterality Date  . TUBAL LIGATION       OB History   No obstetric history on file.     History reviewed. No pertinent family history.  Social History   Tobacco Use  . Smoking status: Current Every Day Smoker  . Smokeless tobacco: Never Used  Substance Use Topics  . Alcohol use: No  . Drug use: No    Home Medications Prior to Admission medications   Medication Sig Start Date End Date Taking? Authorizing  Provider  diphenhydrAMINE (BENADRYL) 25 MG tablet Take 1 tablet (25 mg total) by mouth every 6 (six) hours. 07/02/19   Nils Flack, Mina A, PA-C  hydrocortisone cream 1 % Apply to affected area 2 times daily 07/02/19   Nils Flack, Mina A, PA-C  lidocaine (LIDODERM) 5 % Place 1 patch onto the skin daily. Remove & Discard patch within 12 hours or as directed by MD 01/10/14   Randal Buba, Temprance Wyre, MD  meloxicam (MOBIC) 15 MG tablet Take 1 tablet (15 mg total) by mouth daily. 01/10/14   Myliah Medel, MD  methocarbamol (ROBAXIN) 500 MG tablet Take 1 tablet (500 mg total) by mouth 2 (two) times daily as needed for muscle spasms. 07/30/15   Waynetta Pean, PA-C  naproxen (NAPROSYN) 500 MG tablet Take 1 tablet (500 mg total) by mouth 2 (two) times daily with a meal. 07/30/15   Waynetta Pean, PA-C    Allergies    Patient has no known allergies.  Review of Systems   Review of Systems  Constitutional: Negative for diaphoresis, fatigue and fever.  HENT: Negative for trouble swallowing.   Eyes: Negative for visual disturbance.  Respiratory: Negative for cough and shortness of breath.   Cardiovascular: Positive for chest pain. Negative for palpitations, orthopnea, claudication, syncope, PND and near-syncope.  Gastrointestinal: Negative for abdominal pain, anorexia, heartburn, nausea and vomiting.  Genitourinary: Negative for difficulty urinating.  Musculoskeletal: Negative for  back pain.  Neurological: Negative for dizziness, weakness, numbness and headaches.  Psychiatric/Behavioral: Negative for agitation.  All other systems reviewed and are negative.   Physical Exam Updated Vital Signs BP (!) 152/96 (BP Location: Left Arm)   Pulse 66   Temp 98.1 F (36.7 C) (Oral)   Resp 20   Ht 5\' 1"  (1.549 m)   Wt 61.2 kg   LMP 09/25/2019 (Approximate)   SpO2 99%   BMI 25.51 kg/m   Physical Exam Vitals and nursing note reviewed.  Constitutional:      General: She is not in acute distress.    Appearance: Normal  appearance.  HENT:     Head: Normocephalic and atraumatic.     Nose: Nose normal.  Eyes:     Conjunctiva/sclera: Conjunctivae normal.     Pupils: Pupils are equal, round, and reactive to light.  Cardiovascular:     Rate and Rhythm: Normal rate and regular rhythm.     Pulses: Normal pulses.     Heart sounds: Normal heart sounds.  Pulmonary:     Effort: Pulmonary effort is normal.     Breath sounds: Normal breath sounds.    Chest:     Chest wall: Tenderness present.  Abdominal:     General: Abdomen is flat. Bowel sounds are normal.     Palpations: Abdomen is soft.     Tenderness: There is no abdominal tenderness. There is no guarding or rebound.  Musculoskeletal:        General: Normal range of motion.     Cervical back: Normal range of motion and neck supple.  Skin:    General: Skin is warm and dry.     Capillary Refill: Capillary refill takes less than 2 seconds.  Neurological:     General: No focal deficit present.     Mental Status: She is alert and oriented to person, place, and time.  Psychiatric:        Mood and Affect: Mood normal.        Behavior: Behavior normal.     ED Results / Procedures / Treatments   Labs (all labs ordered are listed, but only abnormal results are displayed) Labs Reviewed  CBC WITH DIFFERENTIAL/PLATELET  BASIC METABOLIC PANEL  PREGNANCY, URINE  TROPONIN I (HIGH SENSITIVITY)    EKG None  Radiology DG Chest Portable 1 View  Result Date: 10/09/2019 CLINICAL DATA:  Chest pain with movement for 2 days, acute progressive worsening right-sided chest pain for 3 hours. EXAM: PORTABLE CHEST 1 VIEW COMPARISON:  None. FINDINGS: The cardiomediastinal contours are normal. The lungs are clear. Pulmonary vasculature is normal. No consolidation, pleural effusion, or pneumothorax. No acute osseous abnormalities are seen. IMPRESSION: No acute chest findings. Electronically Signed   By: 10/11/2019 M.D.   On: 10/09/2019 02:45     Procedures Procedures (including critical care time)  Medications Ordered in ED Medications  acetaminophen (TYLENOL) tablet 1,000 mg (has no administration in time range)  lidocaine (LIDODERM) 5 % 2 patch (has no administration in time range)  ibuprofen (ADVIL) tablet 800 mg (has no administration in time range)    ED Course  I have reviewed the triage vital signs and the nursing notes.  Pertinent labs & imaging results that were available during my care of the patient were reviewed by me and considered in my medical decision making (see chart for details).    PERC negative wells 0, highly doubt PE in this low risk patient.  Gien time course  one troponin was sufficient to exclude ACS.  Heart score is 1 low risk for MACE.  Symptoms are highly atypical for cardiac chest pain. Will treat for MSK pain.    Karen Rodgers was evaluated in Emergency Department on 10/09/2019 for the symptoms described in the history of present illness. She was evaluated in the context of the global COVID-19 pandemic, which necessitated consideration that the patient might be at risk for infection with the SARS-CoV-2 virus that causes COVID-19. Institutional protocols and algorithms that pertain to the evaluation of patients at risk for COVID-19 are in a state of rapid change based on information released by regulatory bodies including the CDC and federal and state organizations. These policies and algorithms were followed during the patient's care in the ED.   Final Clinical Impression(s) / ED Diagnoses Return for intractable cough, coughing up blood,fevers >100.4 unrelieved by medication, shortness of breath, intractable vomiting, chest pain, shortness of breath, weakness,numbness, changes in speech, facial asymmetry,abdominal pain, passing out,Inability to tolerate liquids or food, cough, altered mental status or any concerns. No signs of systemic illness or infection. The patient is nontoxic-appearing  on exam and vital signs are within normal limits.   I have reviewed the triage vital signs and the nursing notes. Pertinent labs &imaging results that were available during my care of the patient were reviewed by me and considered in my medical decision making (see chart for details).After history, exam, and medical workup I feel the patient has beenappropriately medically screened and is safe for discharge home. Pertinent diagnoses were discussed with the patient. Patient was given return precautions.    Karen Suto, MD 10/09/19 [id number]

## 2020-02-12 ENCOUNTER — Encounter (HOSPITAL_BASED_OUTPATIENT_CLINIC_OR_DEPARTMENT_OTHER): Payer: Self-pay | Admitting: Emergency Medicine

## 2020-02-12 ENCOUNTER — Emergency Department (HOSPITAL_BASED_OUTPATIENT_CLINIC_OR_DEPARTMENT_OTHER)
Admission: EM | Admit: 2020-02-12 | Discharge: 2020-02-12 | Disposition: A | Discharge status: Home / Self Care | Payer: Medicare Other | Attending: Emergency Medicine | Admitting: Emergency Medicine

## 2020-02-12 DIAGNOSIS — R197 Diarrhea, unspecified: Secondary | ICD-10-CM | POA: Insufficient documentation

## 2020-02-12 DIAGNOSIS — R519 Headache, unspecified: Secondary | ICD-10-CM | POA: Insufficient documentation

## 2020-02-12 DIAGNOSIS — R1110 Vomiting, unspecified: Secondary | ICD-10-CM | POA: Insufficient documentation

## 2020-02-12 DIAGNOSIS — Z5321 Procedure and treatment not carried out due to patient leaving prior to being seen by health care provider: Secondary | ICD-10-CM | POA: Diagnosis not present

## 2020-02-12 LAB — COMPREHENSIVE METABOLIC PANEL
ALT: 20 U/L (ref 0–44)
AST: 22 U/L (ref 15–41)
Albumin: 4.4 g/dL (ref 3.5–5.0)
Alkaline Phosphatase: 51 U/L (ref 38–126)
Anion gap: 10 (ref 5–15)
BUN: 9 mg/dL (ref 6–20)
CO2: 28 mmol/L (ref 22–32)
Calcium: 9.1 mg/dL (ref 8.9–10.3)
Chloride: 101 mmol/L (ref 98–111)
Creatinine, Ser: 0.7 mg/dL (ref 0.44–1.00)
GFR, Estimated: >60 mL/min (ref >60)
Glucose, Bld: 101 mg/dL — ABNORMAL HIGH (ref 70–99)
Potassium: 3.5 mmol/L (ref 3.5–5.1)
Sodium: 139 mmol/L (ref 135–145)
Total Bilirubin: 0.7 mg/dL (ref 0.3–1.2)
Total Protein: 7.8 g/dL (ref 6.5–8.1)

## 2020-02-12 LAB — CBC
HCT: 40.2 % (ref 36.0–46.0)
Hemoglobin: 12.4 g/dL (ref 12.0–15.0)
MCH: 25.4 pg — ABNORMAL LOW (ref 26.0–34.0)
MCHC: 30.8 g/dL (ref 30.0–36.0)
MCV: 82.2 fL (ref 80.0–100.0)
Platelets: 326 10*3/uL (ref 150–400)
RBC: 4.89 MIL/uL (ref 3.87–5.11)
RDW: 14.5 % (ref 11.5–15.5)
WBC: 4.5 10*3/uL (ref 4.0–10.5)
nRBC: 0 % (ref 0.0–0.2)

## 2020-02-12 LAB — URINALYSIS, ROUTINE W REFLEX MICROSCOPIC
Bilirubin Urine: NEGATIVE
Glucose, UA: NEGATIVE mg/dL
Hgb urine dipstick: NEGATIVE
Ketones, ur: NEGATIVE mg/dL
Leukocytes,Ua: NEGATIVE
Nitrite: NEGATIVE
Protein, ur: NEGATIVE mg/dL
Specific Gravity, Urine: 1.02 (ref 1.005–1.030)
pH: 7.5 (ref 5.0–8.0)

## 2020-02-12 LAB — PREGNANCY, URINE: Preg Test, Ur: NEGATIVE

## 2020-02-12 MED ORDER — ONDANSETRON 4 MG PO TBDP
4.0000 mg | ORAL_TABLET | Freq: Once | ORAL | Status: AC | PRN
  Administered 2020-02-12: 4 mg via ORAL
  Filled 2020-02-12: qty 1

## 2020-02-12 MED ORDER — IBUPROFEN 400 MG PO TABS
400.0000 mg | ORAL_TABLET | Freq: Once | ORAL | Status: AC | PRN
  Administered 2020-02-12: 400 mg via ORAL
  Filled 2020-02-12: qty 1

## 2020-02-12 NOTE — ED Triage Notes (Signed)
Headache since yesterday. Emesis and diarrhea today.

## 2020-05-08 ENCOUNTER — Encounter (HOSPITAL_BASED_OUTPATIENT_CLINIC_OR_DEPARTMENT_OTHER): Payer: Self-pay | Admitting: *Deleted

## 2020-05-08 ENCOUNTER — Emergency Department (HOSPITAL_BASED_OUTPATIENT_CLINIC_OR_DEPARTMENT_OTHER)
Admission: EM | Admit: 2020-05-08 | Discharge: 2020-05-09 | Disposition: A | Discharge status: Home / Self Care | Payer: Medicare Other | Attending: Emergency Medicine | Admitting: Emergency Medicine

## 2020-05-08 ENCOUNTER — Other Ambulatory Visit: Payer: Self-pay

## 2020-05-08 DIAGNOSIS — I10 Essential (primary) hypertension: Secondary | ICD-10-CM | POA: Insufficient documentation

## 2020-05-08 DIAGNOSIS — U071 COVID-19: Secondary | ICD-10-CM | POA: Insufficient documentation

## 2020-05-08 DIAGNOSIS — F17200 Nicotine dependence, unspecified, uncomplicated: Secondary | ICD-10-CM | POA: Diagnosis not present

## 2020-05-08 DIAGNOSIS — Z20822 Contact with and (suspected) exposure to covid-19: Secondary | ICD-10-CM

## 2020-05-08 DIAGNOSIS — R509 Fever, unspecified: Secondary | ICD-10-CM | POA: Diagnosis present

## 2020-05-08 MED ORDER — IBUPROFEN 800 MG PO TABS: 800.0000 mg | ORAL_TABLET | Freq: Once | ORAL | Status: AC

## 2020-05-08 MED ORDER — IBUPROFEN 800 MG PO TABS
ORAL_TABLET | ORAL | Status: AC
  Administered 2020-05-08: 800 mg via ORAL
  Filled 2020-05-08: qty 1

## 2020-05-08 NOTE — ED Triage Notes (Signed)
C/o Covid sx with covid exposure , fever and body aches x 1 day

## 2020-05-09 LAB — SARS CORONAVIRUS 2 (TAT 6-24 HRS): SARS Coronavirus 2: POSITIVE — AB

## 2020-05-09 NOTE — ED Provider Notes (Signed)
[facility] EMERGENCY DEPARTMENT Provider Note   CSN: [id number] Arrival date & time: 05/08/20  2032     History Chief Complaint  Patient presents with  . covid sx  . Covid Exposure    Karen Rodgers is a 40 y.o. female.  The history is provided by the patient.  URI Presenting symptoms: no congestion, no cough and no fever   Severity:  Mild Onset quality:  Gradual Duration:  1 day Timing:  Constant Progression:  Unchanged Chronicity:  New Relieved by:  Nothing Worsened by:  Nothing Ineffective treatments:  None tried Associated symptoms: myalgias   Risk factors: not elderly   Exposed to covid at work.Subjective fevers and body aches x 1 day.  Patient is unvaccinated.       Past Medical History:  Diagnosis Date  . Cervical disc disorder   . Hypertension     There are no problems to display for this patient.   Past Surgical History:  Procedure Laterality Date  . TUBAL LIGATION       OB History   No obstetric history on file.     No family history on file.  Social History   Tobacco Use  . Smoking status: Current Every Day Smoker  . Smokeless tobacco: Never Used  Substance Use Topics  . Alcohol use: No  . Drug use: Yes    Types: Marijuana    Home Medications Prior to Admission medications   Medication Sig Start Date End Date Taking? Authorizing Provider  diphenhydrAMINE (BENADRYL) 25 MG tablet Take 1 tablet (25 mg total) by mouth every 6 (six) hours. 07/02/19   Luevenia Maxin, Mina A, PA-C  hydrocortisone cream 1 % Apply to affected area 2 times daily 07/02/19   Luevenia Maxin, Mina A, PA-C  ibuprofen (ADVIL) 800 MG tablet Take 1 tablet (800 mg total) by mouth 3 (three) times daily. 10/09/19   Shawniece Oyola, MD  lidocaine (LIDODERM) 5 % Place 1 patch onto the skin daily. Remove & Discard patch within 12 hours or as directed by MD 01/10/14   Nicanor Alcon, Kairav Russomanno, MD  meloxicam (MOBIC) 15 MG tablet Take 1 tablet (15 mg total) by mouth daily. 01/10/14   Valynn Schamberger,  Bethzaida Boord, MD  methocarbamol (ROBAXIN) 500 MG tablet Take 1 tablet (500 mg total) by mouth 2 (two) times daily as needed for muscle spasms. 07/30/15   Everlene Farrier, PA-C  naproxen (NAPROSYN) 500 MG tablet Take 1 tablet (500 mg total) by mouth 2 (two) times daily with a meal. 07/30/15   Everlene Farrier, PA-C    Allergies    Patient has no known allergies.  Review of Systems   Review of Systems  Constitutional: Negative for fever.  HENT: Negative for congestion.   Eyes: Negative for visual disturbance.  Respiratory: Negative for cough and shortness of breath.   Cardiovascular: Negative for chest pain.  Gastrointestinal: Negative for abdominal pain.  Genitourinary: Negative for difficulty urinating.  Musculoskeletal: Positive for myalgias.  Neurological: Negative for dizziness.  Psychiatric/Behavioral: Negative for agitation.  All other systems reviewed and are negative.   Physical Exam Updated Vital Signs BP 129/88 (BP Location: Left Arm)   Pulse 63   Temp 98.3 F (36.8 C) (Oral)   Resp 16   Ht 5\' 1"  (1.549 m)   Wt 63.5 kg   LMP 04/26/2020   SpO2 100%   BMI 26.45 kg/m   Physical Exam Vitals and nursing note reviewed.  Constitutional:      General: She is not in acute  distress.    Appearance: Normal appearance.  HENT:     Head: Normocephalic and atraumatic.     Nose: Nose normal.  Eyes:     Conjunctiva/sclera: Conjunctivae normal.     Pupils: Pupils are equal, round, and reactive to light.  Cardiovascular:     Rate and Rhythm: Normal rate and regular rhythm.     Pulses: Normal pulses.     Heart sounds: Normal heart sounds.  Pulmonary:     Effort: Pulmonary effort is normal.     Breath sounds: Normal breath sounds.  Abdominal:     General: Abdomen is flat. Bowel sounds are normal.     Palpations: Abdomen is soft.     Tenderness: There is no abdominal tenderness. There is no guarding.  Musculoskeletal:        General: Normal range of motion.     Cervical back:  Normal range of motion and neck supple. No rigidity.  Skin:    General: Skin is warm and dry.     Capillary Refill: Capillary refill takes less than 2 seconds.  Neurological:     General: No focal deficit present.     Mental Status: She is alert and oriented to person, place, and time.     Deep Tendon Reflexes: Reflexes normal.  Psychiatric:        Mood and Affect: Mood normal.        Behavior: Behavior normal.     ED Results / Procedures / Treatments   Labs (all labs ordered are listed, but only abnormal results are displayed) Labs Reviewed  SARS CORONAVIRUS 2 (TAT 6-24 HRS)    EKG None  Radiology No results found.  Procedures Procedures (including critical care time)  Medications Ordered in ED Medications  ibuprofen (ADVIL) tablet 800 mg (800 mg Oral Given 05/08/20 2049)    ED Course  I have reviewed the triage vital signs and the nursing notes.  Pertinent labs & imaging results that were available during my care of the patient were reviewed by me and considered in my medical decision making (see chart for details).   Patient is angry that she did not get the rapid test for all viral etiologies.  EDP apologized profusely for this.  We will provide a work note.  Patient is well appearing and no additional work up is needed at this time.     Karen Rodgers was evaluated in Emergency Department on 05/09/2020 for the symptoms described in the history of present illness. She was evaluated in the context of the global COVID-19 pandemic, which necessitated consideration that the patient might be at risk for infection with the SARS-CoV-2 virus that causes COVID-19. Institutional protocols and algorithms that pertain to the evaluation of patients at risk for COVID-19 are in a state of rapid change based on information released by regulatory bodies including the CDC and federal and state organizations. These policies and algorithms were followed during the patient's care in the  ED.  Final Clinical Impression(s) / ED Diagnoses  Return for intractable cough, coughing up blood,fevers >100.4 unrelieved by medication, shortness of breath, intractable vomiting, chest pain, shortness of breath, weakness,numbness, changes in speech, facial asymmetry,abdominal pain, passing out,Inability to tolerate liquids or food, cough, altered mental status or any concerns. No signs of systemic illness or infection. The patient is nontoxic-appearing on exam and vital signs are within normal limits.   I have reviewed the triage vital signs and the nursing notes. Pertinent labs &imaging results that were available  during my care of the patient were reviewed by me and considered in my medical decision making (see chart for details).After history, exam, and medical workup I feel the patient has beenappropriately medically screened and is safe for discharge home. Pertinent diagnoses were discussed with the patient. Patient was given return precautions.   Farron Watrous, MD 05/09/20 [phone]

## 2020-05-09 NOTE — ED Notes (Signed)
AVS provided to client, discussed staying hydrated, discussed use of Tylenol and Ibuprofen. Informed pt that if Covid results are Positive she will rec a phone call of those results. Opportunity for questions provided

## 2020-05-09 NOTE — Discharge Instructions (Signed)
Person Under Monitoring Name: Karen Rodgers  Location: [address] [place] [address]   Infection Prevention Recommendations for Individuals Confirmed to have, or Being Evaluated for, 2019 Novel Coronavirus (COVID-19) Infection Who Receive Care at Home  Individuals who are confirmed to have, or are being evaluated for, COVID-19 should follow the prevention steps below until a healthcare provider or local or state health department says they can return to normal activities.  Stay home except to get medical care You should restrict activities outside your home, except for getting medical care. Do not go to work, school, or public areas, and do not use public transportation or taxis.  Call ahead before visiting your doctor Before your medical appointment, call the healthcare provider and tell them that you have, or are being evaluated for, COVID-19 infection. This will help the healthcare providers office take steps to keep other people from getting infected. Ask your healthcare provider to call the local or state health department.  Monitor your symptoms Seek prompt medical attention if your illness is worsening (e.g., difficulty breathing). Before going to your medical appointment, call the healthcare provider and tell them that you have, or are being evaluated for, COVID-19 infection. Ask your healthcare provider to call the local or state health department.  Wear a facemask You should wear a facemask that covers your nose and mouth when you are in the same room with other people and when you visit a healthcare provider. People who live with or visit you should also wear a facemask while they are in the same room with you.  Separate yourself from other people in your home As much as possible, you should stay in a different room from other people in your home. Also, you should use a separate bathroom, if available.  Avoid sharing household items You should  not share dishes, drinking glasses, cups, eating utensils, towels, bedding, or other items with other people in your home. After using these items, you should wash them thoroughly with soap and water.  Cover your coughs and sneezes Cover your mouth and nose with a tissue when you cough or sneeze, or you can cough or sneeze into your sleeve. Throw used tissues in a lined trash can, and immediately wash your hands with soap and water for at least 20 seconds or use an alcohol-based hand rub.  Wash your [employer] your hands often and thoroughly with soap and water for at least 20 seconds. You can use an alcohol-based hand sanitizer if soap and water are not available and if your hands are not visibly dirty. Avoid touching your eyes, nose, and mouth with unwashed hands.   Prevention Steps for Caregivers and Household Members of Individuals Confirmed to have, or Being Evaluated for, COVID-19 Infection Being Cared for in the Home  If you live with, or provide care at home for, a person confirmed to have, or being evaluated for, COVID-19 infection please follow these guidelines to prevent infection:  Follow healthcare providers instructions Make sure that you understand and can help the patient follow any healthcare provider instructions for all care.  Provide for the patients basic needs You should help the patient with basic needs in the home and provide support for getting groceries, prescriptions, and other personal needs.  Monitor the patients symptoms If they are getting sicker, call his or her medical provider and tell them that the patient has, or is being evaluated for, COVID-19 infection. This will help the healthcare  providers office take steps to keep other people from getting infected. Ask the healthcare provider to call the local or state health department.  Limit the number of people who have contact with the patient If possible, have only one caregiver for the  patient. Other household members should stay in another home or place of residence. If this is not possible, they should stay in another room, or be separated from the patient as much as possible. Use a separate bathroom, if available. Restrict visitors who do not have an essential need to be in the home.  Keep older adults, very young children, and other sick people away from the patient Keep older adults, very young children, and those who have compromised immune systems or chronic health conditions away from the patient. This includes people with chronic heart, lung, or kidney conditions, diabetes, and cancer.  Ensure good ventilation Make sure that shared spaces in the home have good air flow, such as from an air conditioner or an opened window, weather permitting.  Wash your hands often Wash your hands often and thoroughly with soap and water for at least 20 seconds. You can use an alcohol based hand sanitizer if soap and water are not available and if your hands are not visibly dirty. Avoid touching your eyes, nose, and mouth with unwashed hands. Use disposable paper towels to dry your hands. If not available, use dedicated cloth towels and replace them when they become wet.  Wear a facemask and gloves Wear a disposable facemask at all times in the room and gloves when you touch or have contact with the patients blood, body fluids, and/or secretions or excretions, such as sweat, saliva, sputum, nasal mucus, vomit, urine, or feces.  Ensure the mask fits over your nose and mouth tightly, and do not touch it during use. Throw out disposable facemasks and gloves after using them. Do not reuse. Wash your hands immediately after removing your facemask and gloves. If your personal clothing becomes contaminated, carefully remove clothing and launder. Wash your hands after handling contaminated clothing. Place all used disposable facemasks, gloves, and other waste in a lined container before  disposing them with other household waste. Remove gloves and wash your hands immediately after handling these items.  Do not share dishes, glasses, or other household items with the patient Avoid sharing household items. You should not share dishes, drinking glasses, cups, eating utensils, towels, bedding, or other items with a patient who is confirmed to have, or being evaluated for, COVID-19 infection. After the person uses these items, you should wash them thoroughly with soap and water.  Wash laundry thoroughly Immediately remove and wash clothes or bedding that have blood, body fluids, and/or secretions or excretions, such as sweat, saliva, sputum, nasal mucus, vomit, urine, or feces, on them. Wear gloves when handling laundry from the patient. Read and follow directions on labels of laundry or clothing items and detergent. In general, wash and dry with the warmest temperatures recommended on the label.  Clean all areas the individual has used often Clean all touchable surfaces, such as counters, tabletops, doorknobs, bathroom fixtures, toilets, phones, keyboards, tablets, and bedside tables, every day. Also, clean any surfaces that may have blood, body fluids, and/or secretions or excretions on them. Wear gloves when cleaning surfaces the patient has come in contact with. Use a diluted bleach solution (e.g., dilute bleach with 1 part bleach and 10 parts water) or a household disinfectant with a label that says EPA-registered for coronaviruses. To make  a bleach solution at home, add 1 tablespoon of bleach to 1 quart (4 cups) of water. For a larger supply, add  cup of bleach to 1 gallon (16 cups) of water. Read labels of cleaning products and follow recommendations provided on product labels. Labels contain instructions for safe and effective use of the cleaning product including precautions you should take when applying the product, such as wearing gloves or eye protection and making sure you  have good ventilation during use of the product. Remove gloves and wash hands immediately after cleaning.  Monitor yourself for signs and symptoms of illness Caregivers and household members are considered close contacts, should monitor their health, and will be asked to limit movement outside of the home to the extent possible. Follow the monitoring steps for close contacts listed on the symptom monitoring form.   ? If you have additional questions, contact your local health department or call the epidemiologist on call at [phone] (available 24/7). ? This guidance is subject to change. For the most up-to-date guidance from [facility], please refer to their website: [email or web address]

## 2020-05-09 NOTE — ED Notes (Signed)
Covid Exposure at work, been having fevers, body aches, eating ok, taking in POs well, no nausea, vomiting or diarrhea. Onset today for signs and symptoms. Covid Swab obtained. Pt placed on cont POX monitoring and int NBP measurements

## 2021-12-27 IMAGING — DX DG CHEST 1V PORT
1 series · 1 of 1 positions shown · non-contrast
Comparison: None.

CLINICAL DATA: Chest pain with movement for 2 days, acute
progressive worsening right-sided chest pain for 3 hours.

EXAM:
PORTABLE CHEST 1 VIEW

[chest ap]
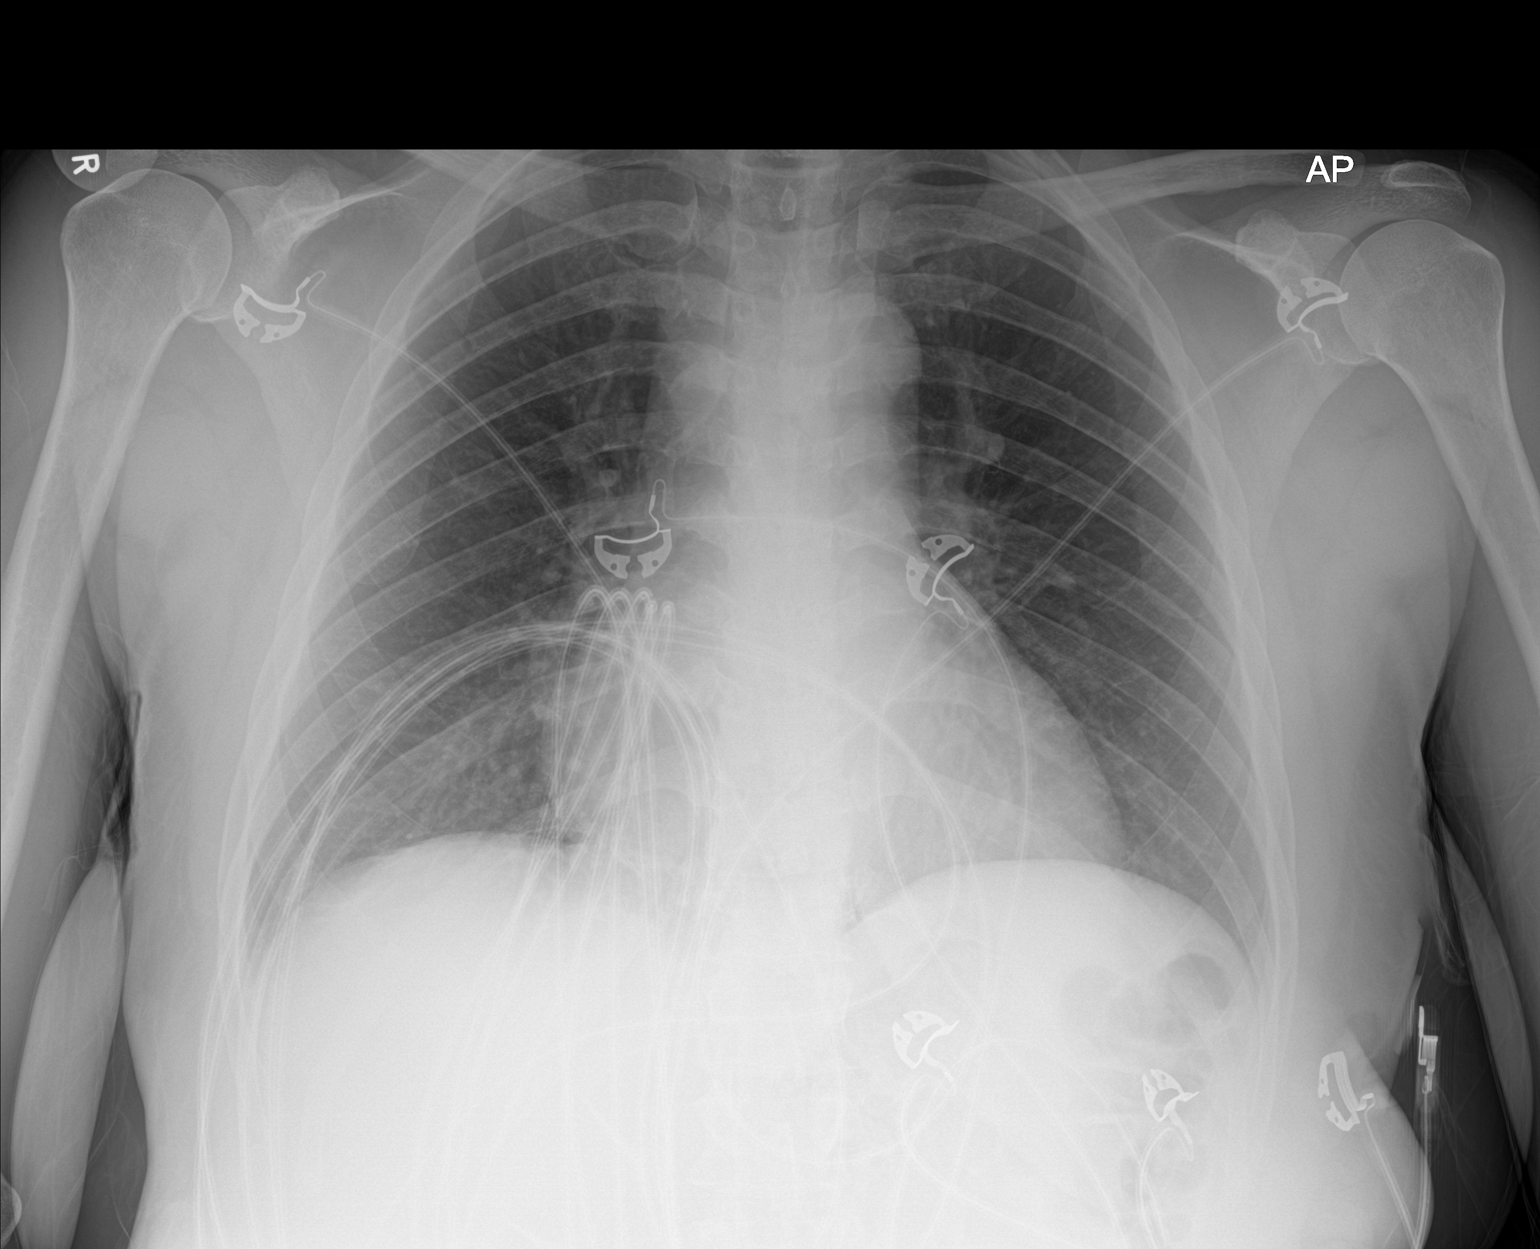

[1 of 1 positions shown; findings below may reference images not displayed]

FINDINGS: The cardiomediastinal contours are normal. The lungs are clear.
Pulmonary vasculature is normal. No consolidation, pleural effusion,
or pneumothorax. No acute osseous abnormalities are seen.
IMPRESSION: No acute chest findings.

## 2023-03-06 ENCOUNTER — Emergency Department (HOSPITAL_COMMUNITY): Payer: Medicare Other

## 2023-03-06 ENCOUNTER — Emergency Department (HOSPITAL_COMMUNITY)
Admission: EM | Admit: 2023-03-06 | Discharge: 2023-03-06 | Disposition: A | Discharge status: Home / Self Care | Payer: Medicare Other | Attending: Emergency Medicine | Admitting: Emergency Medicine

## 2023-03-06 ENCOUNTER — Other Ambulatory Visit: Payer: Self-pay

## 2023-03-06 DIAGNOSIS — M79602 Pain in left arm: Secondary | ICD-10-CM | POA: Diagnosis present

## 2023-03-06 DIAGNOSIS — R6884 Jaw pain: Secondary | ICD-10-CM | POA: Diagnosis not present

## 2023-03-06 NOTE — ED Notes (Signed)
Notified Dr. Kathline Magic, patient is threatening to call hospital administration if she doesn't get her discharge paperwork in the next 30 minutes. MD states he is unavailable to provide update to patient at the present moment. Patient is free to leave if she likes. Pt refusing to sign AMA form and leave without discharge paperwork.

## 2023-03-06 NOTE — ED Notes (Signed)
Pt transported to CT ?

## 2023-03-06 NOTE — ED Notes (Signed)
I
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
## 2023-03-06 NOTE — ED Notes (Signed)
Pt aggressive with staff about wanting to leave but does not want to leave without discharge paperwork. MD notified

## 2023-03-06 NOTE — ED Notes (Signed)
Pt refusing vitals reassessment.

## 2023-03-06 NOTE — ED Notes (Addendum)
I asked the pt if I could get some vitals before she left and she denied the vitals. She said that she just wanted her paperwork and to be discharged, the nurse was notified and is messaging the doctor.

## 2023-04-21 ENCOUNTER — Ambulatory Visit (INDEPENDENT_AMBULATORY_CARE_PROVIDER_SITE_OTHER): Payer: Medicare Other | Admitting: Nurse Practitioner

## 2023-04-21 ENCOUNTER — Encounter: Payer: Self-pay | Admitting: Obstetrics and Gynecology

## 2023-04-21 ENCOUNTER — Other Ambulatory Visit (HOSPITAL_COMMUNITY)
Admission: RE | Admit: 2023-04-21 | Discharge: 2023-04-21 | Disposition: A | Discharge status: Home / Self Care | Payer: Medicare Other | Source: Ambulatory Visit | Attending: Obstetrics and Gynecology | Admitting: Obstetrics and Gynecology

## 2023-04-21 VITALS — BP 179/98 | HR 67 | Wt 149.8 lb

## 2023-04-21 DIAGNOSIS — Z1151 Encounter for screening for human papillomavirus (HPV): Secondary | ICD-10-CM | POA: Diagnosis not present

## 2023-04-21 DIAGNOSIS — Z3202 Encounter for pregnancy test, result negative: Secondary | ICD-10-CM | POA: Diagnosis not present

## 2023-04-21 DIAGNOSIS — A599 Trichomoniasis, unspecified: Secondary | ICD-10-CM

## 2023-04-21 DIAGNOSIS — N926 Irregular menstruation, unspecified: Secondary | ICD-10-CM | POA: Diagnosis not present

## 2023-04-21 DIAGNOSIS — Z01419 Encounter for gynecological examination (general) (routine) without abnormal findings: Secondary | ICD-10-CM

## 2023-04-21 DIAGNOSIS — Z124 Encounter for screening for malignant neoplasm of cervix: Secondary | ICD-10-CM

## 2023-04-21 DIAGNOSIS — R030 Elevated blood-pressure reading, without diagnosis of hypertension: Secondary | ICD-10-CM | POA: Diagnosis not present

## 2023-04-21 DIAGNOSIS — Z113 Encounter for screening for infections with a predominantly sexual mode of transmission: Secondary | ICD-10-CM

## 2023-04-21 DIAGNOSIS — Z1231 Encounter for screening mammogram for malignant neoplasm of breast: Secondary | ICD-10-CM

## 2023-04-21 LAB — POCT URINE PREGNANCY: Preg Test, Ur: NEGATIVE

## 2023-04-21 NOTE — Progress Notes (Deleted)
   GYNECOLOGY PROGRESS NOTE  History:  42 y.o. G5P0 presents to [facility] *** office today for problem gyn visit. She reports *****.  She denies h/a, dizziness, shortness of breath, n/v, or fever/chills.    The following portions of the patient's history were reviewed and updated as appropriate: allergies, current medications, past family history, past medical history, past social history, past surgical history and problem list. Last pap smear on *** was normal, *** HRHPV.  Health Maintenance Due  Topic Date Due   Medicare Annual Wellness (AWV)  Never done   HIV Screening  Never done   Hepatitis C Screening  Never done   DTaP/Tdap/Td (1 - Tdap) Never done   Cervical Cancer Screening (HPV/Pap Cotest)  Never done   INFLUENZA VACCINE  Never done   COVID-19 Vaccine (1 - 2024-25 season) Never done     Review of Systems:  Pertinent items are noted in HPI.   Objective:  Physical Exam Blood pressure (!) 179/98, pulse 67, weight 149 lb 12.8 oz (67.9 kg), last menstrual period 04/07/2023. VS reviewed, nursing note reviewed,  Constitutional: well developed, well nourished, no distress HEENT: normocephalic CV: normal rate Pulm/chest wall: normal effort Breast Exam: deferred Abdomen: soft Neuro: alert and oriented x 3 Skin: warm, dry Psych: affect normal Pelvic exam: Cervix pink, visually closed, without lesion, scant white creamy discharge, vaginal walls and external genitalia normal Bimanual exam: Cervix 0/long/high, firm, anterior, neg CMT, uterus nontender, nonenlarged, adnexa without tenderness, enlargement, or mass  Assessment & Plan:  There are no diagnoses linked to this encounter.    Nidia Daring, FNP

## 2023-04-21 NOTE — Progress Notes (Signed)
 ANNUAL EXAM Patient name: Karen Rodgers MRN [id number]  Date of birth: 1980/06/11 Chief Complaint:   Establish Care  History of Present Illness:   Karen Rodgers is a 42 y.o. G48P0 African-American female being seen today for a routine annual exam.  Current complaints: patient c/o an abnormal period this month that lasted only 2-3 days very light and also some mild breast tenderness with nipple discharge. She reports that her regular menses are usually 3-4 days of regular flow and therefore she is concerned that she may be pregnant and is requesting a serum blood test to confirm. She had a negative home test and has had a previous BTL ~ 16 years ago. She also c/o some lower back pain and some mild abdominal cramping. She is also here today for STI testing   BP here was noted to be elevated at 188/99 with repeat BP of 179/98. Patient reports that she does not have a PCP and denied any history of hypertension that has been DX although she reports a [facility] of hypertension. She denies any HA's, visual changes, RUQ pain, SOB, or chest pain.  Patient's last menstrual period was 04/07/2023 (approximate).   The pregnancy intention screening data noted above was reviewed. Potential methods of contraception were discussed. The patient had a BTL ~16 years ago per patient report.   Last pap unknown per patient.  Per Care everywhere Last pap was documented  as performed in 2001 (no results available) and patient unable to recall if she ever had an abnormal pap. Last mammogram: per care everywhere 2020 was last documented mammogram . Results were: not documented. Family h/o breast cancer: not documented and patient unable to recall Last colonoscopy: NA. Results were: N/A. Family h/o colorectal cancer: not documented and unable to recall      No data to display               No data to display           Review of Systems:   Pertinent items are noted in HPI Denies any headaches, blurred  vision, fatigue, shortness of breath, chest pain, abdominal pain, abnormal vaginal discharge/itching/odor/irritation, problems with periods, bowel movements, urination, or intercourse unless otherwise stated above.  Pertinent History Reviewed:  Reviewed past medical,surgical, social and family history.  Reviewed problem list, medications and allergies.  Physical Assessment:   Vitals:   04/21/23 0825 04/21/23 0831  BP: (!) 188/99 (!) 179/98  Pulse: 98 67  Weight: 149 lb 12.8 oz (67.9 kg)    Body mass index is 28.3 kg/m.   Physical Examination:  General appearance - well appearing, and in no distress Mental status - alert, oriented to person, place, and time Psych:  She has a normal mood and affect  Skin - warm and dry, normal color, no suspicious lesions noted Chest - effort normal, no problems with respiration noted Heart - normal rate and regular rhythm Neck:  midline trachea, no thyromegaly or nodules Breasts - breasts appear normal, no suspicious masses, no skin or nipple changes or axillary nodes Abdomen - soft, nontender, nondistended, no masses or organomegaly, + BS x 4 Pelvic - VULVA: normal appearing vulva with no masses, tenderness or lesions   VAGINA: normal appearing vagina with normal color and discharge, no lesions   CERVIX: normal appearing cervix without discharge or lesions, no CMT Thin prep pap is done with HPV cotesting Extremities:  No swelling or varicosities noted  Chaperone present for exam  Results for orders placed  or performed in visit on 04/21/23 (from the past 24 hours)  POCT urine pregnancy   Collection Time: 04/21/23  9:37 AM  Result Value Ref Range   Preg Test, Ur Negative Negative    Assessment & Plan:      1. Well woman exam (Primary)  - Cytology - PAP( [place]) - CBC - MM 3D SCREENING MAMMOGRAM BILATERAL BREAST; Future  2. Screen for STD (sexually transmitted disease)  - Cervicovaginal ancillary only( [place]) - HIV antibody  (with reflex) - RPR  3. Elevated BP without diagnosis of hypertension  - CBC - Comp Met (CMET) - Protein / creatinine ratio, urine - Ambulatory Referral to Primary Care - Patient strongly counseled to follow up with PCP regarding labs ordered today as baseline to facilitate patient care   4. Irregular periods  - POCT urine pregnancy - Prolactin - FSH/LH - Estradiol  - Beta hCG quant (ref lab) - Progesterone   5. Encounter for screening mammogram for malignant neoplasm of breast  - MM 3D SCREENING MAMMOGRAM BILATERAL BREAST; Future  - Will follow up results of pap smear and manage accordingly. - Mammogram scheduled - Routine preventative health maintenance measures emphasized. - Please refer to After Visit Summary for other counseling recommendations.       Mammogram:  ordered and recommended scheduling as soon as able   to schedule   Orders Placed This Encounter  Procedures   MM 3D SCREENING MAMMOGRAM BILATERAL BREAST   CBC   HIV antibody (with reflex)   RPR   Prolactin   FSH/LH   Estradiol    Beta hCG quant (ref lab)   Progesterone    Comp Met (CMET)   Protein / creatinine ratio, urine   Ambulatory Referral to Primary Care   POCT urine pregnancy    Meds: No orders of the defined types were placed in this encounter.   Follow-up: Return in about 6 months (around 10/19/2023), or follow up.   Established with PCP and will discuss menstrual cycle review   Olam DELENA Dalton, NP 04/21/2023 12:57 PM

## 2023-04-21 NOTE — Progress Notes (Signed)
 Pt. Presents to est. Care. Pt. Did not have a normal period for this month. Pt. Was given a pregnancy test and was negative. Pt. Wants to have a blood pregnancy test to confirm.

## 2023-04-22 LAB — FSH/LH
FSH: 15 m[IU]/mL
LH: 84 m[IU]/mL

## 2023-04-22 LAB — COMPREHENSIVE METABOLIC PANEL
ALT: 7 [IU]/L (ref 0–32)
AST: 14 [IU]/L (ref 0–40)
Albumin: 4.5 g/dL (ref 3.9–4.9)
Alkaline Phosphatase: 59 [IU]/L (ref 44–121)
BUN/Creatinine Ratio: 14 (ref 9–23)
BUN: 11 mg/dL (ref 6–24)
Bilirubin Total: <0.2 mg/dL (ref 0.0–1.2)
CO2: 21 mmol/L (ref 20–29)
Calcium: 9.3 mg/dL (ref 8.7–10.2)
Chloride: 103 mmol/L (ref 96–106)
Creatinine, Ser: 0.8 mg/dL (ref 0.57–1.00)
Globulin, Total: 2.4 g/dL (ref 1.5–4.5)
Glucose: 94 mg/dL (ref 70–99)
Potassium: 4.2 mmol/L (ref 3.5–5.2)
Sodium: 142 mmol/L (ref 134–144)
Total Protein: 6.9 g/dL (ref 6.0–8.5)
eGFR: 93 mL/min/{1.73_m2} (ref >59)

## 2023-04-22 LAB — CBC
Hematocrit: 36 % (ref 34.0–46.6)
Hemoglobin: 10.4 g/dL — ABNORMAL LOW (ref 11.1–15.9)
MCH: 23.3 pg — ABNORMAL LOW (ref 26.6–33.0)
MCHC: 28.9 g/dL — ABNORMAL LOW (ref 31.5–35.7)
MCV: 81 fL (ref 79–97)
Platelets: 361 10*3/uL (ref 150–450)
RBC: 4.47 x10E6/uL (ref 3.77–5.28)
RDW: 14.4 % (ref 11.7–15.4)
WBC: 4.3 10*3/uL (ref 3.4–10.8)

## 2023-04-22 LAB — PROLACTIN: Prolactin: 35.9 ng/mL — ABNORMAL HIGH (ref 4.8–33.4)

## 2023-04-22 LAB — PROGESTERONE: Progesterone: 0.6 ng/mL

## 2023-04-22 LAB — RPR: RPR Ser Ql: NONREACTIVE

## 2023-04-22 LAB — BETA HCG QUANT (REF LAB): hCG Quant: <1 m[IU]/mL

## 2023-04-22 LAB — HIV ANTIBODY (ROUTINE TESTING W REFLEX): HIV Screen 4th Generation wRfx: NONREACTIVE

## 2023-04-22 LAB — ESTRADIOL: Estradiol: 429 pg/mL

## 2023-04-23 LAB — CERVICOVAGINAL ANCILLARY ONLY
Bacterial Vaginitis (gardnerella): POSITIVE — AB
Candida Glabrata: NEGATIVE
Candida Vaginitis: NEGATIVE
Chlamydia: NEGATIVE
Comment: NEGATIVE
Comment: NEGATIVE
Comment: NEGATIVE
Comment: NEGATIVE
Comment: NEGATIVE
Comment: NORMAL
Neisseria Gonorrhea: NEGATIVE
Trichomonas: POSITIVE — AB

## 2023-04-23 LAB — PROTEIN / CREATININE RATIO, URINE
Creatinine, Urine: 208.7 mg/dL
Protein, Ur: 61.9 mg/dL
Protein/Creat Ratio: 297 mg/g{creat} — ABNORMAL HIGH (ref 0–200)

## 2023-04-24 LAB — CYTOLOGY - PAP
Comment: NEGATIVE
Diagnosis: NEGATIVE
High risk HPV: POSITIVE — AB

## 2023-04-27 MED ORDER — METRONIDAZOLE 500 MG PO TABS: 500.0000 mg | ORAL_TABLET | Freq: Two times a day (BID) | ORAL | 0 refills | Status: AC

## 2023-04-27 NOTE — Addendum Note (Signed)
 Addended by: Albertine Grates L on: 04/27/2023 01:00 PM   Modules accepted: Orders

## 2023-05-01 NOTE — Progress Notes (Signed)
 TC. No answer. Left HIPAA compliant VM with call back number and indication that [facility] msg would be sent. [facility] msg with education sent.

## 2023-06-18 ENCOUNTER — Encounter (HOSPITAL_COMMUNITY): Payer: Self-pay

## 2023-06-18 ENCOUNTER — Emergency Department (HOSPITAL_COMMUNITY)
Admission: EM | Admit: 2023-06-18 | Discharge: 2023-06-18 | Discharge status: Against Medical Advice | Payer: Medicaid Other | Attending: Emergency Medicine | Admitting: Emergency Medicine

## 2023-06-18 ENCOUNTER — Other Ambulatory Visit: Payer: Self-pay

## 2023-06-18 DIAGNOSIS — R350 Frequency of micturition: Secondary | ICD-10-CM | POA: Insufficient documentation

## 2023-06-18 DIAGNOSIS — R319 Hematuria, unspecified: Secondary | ICD-10-CM | POA: Diagnosis not present

## 2023-06-18 DIAGNOSIS — Z5321 Procedure and treatment not carried out due to patient leaving prior to being seen by health care provider: Secondary | ICD-10-CM | POA: Diagnosis not present

## 2023-06-18 NOTE — ED Notes (Signed)
 Called pt to redo urine sample, no answer

## 2023-06-18 NOTE — ED Triage Notes (Signed)
 Patient reports urinary frequency, bladder spasms and some blood in urine x 1 week. Denies fever.

## 2023-06-18 NOTE — ED Notes (Signed)
 Lab called lid was not on tight and spilled in bag.

## 2023-06-18 NOTE — ED Notes (Signed)
 Called for pt for VS recheck, no answer, NT called pt earlier without answer, unable to locate pt in lobby.
